# Patient Record
Sex: Male | Born: 1995 | Race: Black or African American | Hispanic: No | Marital: Single | Smoking: Never smoker
Health system: Southern US, Community
[De-identification: ages and names within clinical notes are randomized; demographics above are authoritative.]

## PROBLEM LIST (undated history)

## (undated) DIAGNOSIS — J45909 Unspecified asthma, uncomplicated: Secondary | ICD-10-CM

## (undated) DIAGNOSIS — T7840XA Allergy, unspecified, initial encounter: Secondary | ICD-10-CM

## (undated) DIAGNOSIS — M4846XA Fatigue fracture of vertebra, lumbar region, initial encounter for fracture: Secondary | ICD-10-CM

## (undated) HISTORY — DX: Fatigue fracture of vertebra, lumbar region, initial encounter for fracture: M48.46XA

## (undated) HISTORY — DX: Unspecified asthma, uncomplicated: J45.909

## (undated) HISTORY — PX: NO PAST SURGERIES: SHX2092

## (undated) HISTORY — DX: Allergy, unspecified, initial encounter: T78.40XA

## (undated) HISTORY — PX: UMBILICAL HERNIA REPAIR: SHX196

---
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
## 2016-05-21 ENCOUNTER — Emergency Department (INDEPENDENT_AMBULATORY_CARE_PROVIDER_SITE_OTHER): Payer: PRIVATE HEALTH INSURANCE

## 2016-05-21 ENCOUNTER — Encounter: Payer: Self-pay | Admitting: Emergency Medicine

## 2016-05-21 ENCOUNTER — Emergency Department (INDEPENDENT_AMBULATORY_CARE_PROVIDER_SITE_OTHER)
Admission: EM | Admit: 2016-05-21 | Discharge: 2016-05-21 | Disposition: A | Discharge status: Home / Self Care | Payer: PRIVATE HEALTH INSURANCE | Source: Home / Self Care | Attending: Family Medicine | Admitting: Family Medicine

## 2016-05-21 DIAGNOSIS — S6992XA Unspecified injury of left wrist, hand and finger(s), initial encounter: Secondary | ICD-10-CM | POA: Diagnosis not present

## 2016-05-21 DIAGNOSIS — M79642 Pain in left hand: Secondary | ICD-10-CM | POA: Diagnosis not present

## 2016-05-21 NOTE — ED Provider Notes (Signed)
CSN: [id number]     Arrival date & time 05/21/16  1003 History   First MD Initiated Contact with Patient 05/21/16 1019     Chief Complaint  Patient presents with  . Hand Injury    left   (Consider location/radiation/quality/duration/timing/severity/associated sxs/prior Treatment) HPI  Jon Salazar is a 21 y.o. male presenting to UC with c/o Left hand pain that started about 1 month ago while playing basketball.  Pain is still aching and sore. Minimal relief with OTC medications. He id ambidextrous.    Past Medical History:  Diagnosis Date  . Allergy   . Asthma    Past Surgical History:  Procedure Laterality Date  . UMBILICAL HERNIA REPAIR     Family History  Problem Relation Age of Onset  . Hypertension Mother    Social History  Substance Use Topics  . Smoking status: Never Smoker  . Smokeless tobacco: Never Used  . Alcohol use No    Review of Systems  Musculoskeletal: Positive for arthralgias and myalgias. Negative for joint swelling.  Skin: Negative for color change and wound.  Neurological: Negative for weakness and numbness.    Allergies  Peanut-containing drug products and Shellfish allergy  Home Medications   Prior to Admission medications   Medication Sig Start Date End Date Taking? Authorizing Provider  albuterol (PROVENTIL HFA;VENTOLIN HFA) 108 (90 BASE) MCG/ACT inhaler Inhale 2 puffs into the lungs every 6 (six) hours as needed.    Historical Provider, MD  AMBULATORY NON FORMULARY MEDICATION TLSO brace to be worn at all times. 08/04/12   Monica Bectonhomas J Thekkekandam, MD  beclomethasone (QVAR) 80 MCG/ACT inhaler Inhale 1 puff into the lungs as needed.    Historical Provider, MD  cetirizine (ZYRTEC) 10 MG tablet Take 10 mg by mouth daily.    Historical Provider, MD  montelukast (SINGULAIR) 10 MG tablet Take 10 mg by mouth at bedtime.    Historical Provider, MD   Meds Ordered and Administered this Visit  Medications - No data to display  BP 132/80 (BP  Location: Right Arm)   Pulse (!) 51   Temp 98 F (36.7 C) (Oral)   Resp 16   Ht 6\' 3"  (1.905 m)   Wt 150 lb (68 kg)   SpO2 100%   BMI 18.75 kg/m  No data found.   Physical Exam  Constitutional: He is oriented to person, place, and time. He appears well-developed and well-nourished.  HENT:  Head: Normocephalic and atraumatic.  Eyes: EOM are normal.  Neck: Normal range of motion.  Cardiovascular: Normal rate.   Pulmonary/Chest: Effort normal.  Musculoskeletal: Normal range of motion. He exhibits tenderness. He exhibits no edema or deformity.  Left hand: no obvious edema or deformity. Full ROM. Tenderness over 5th MCP joint. No crepitus.  Full ROM wrist, non-tender.  Neurological: He is alert and oriented to person, place, and time.  Skin: Skin is warm and dry. Capillary refill takes less than 2 seconds.  Left hand: skin in tact. No ecchymosis or erythema.  Psychiatric: He has a normal mood and affect. His behavior is normal.  Nursing note and vitals reviewed.   Urgent Care Course     Procedures (including critical care time)  Labs Review Labs Reviewed - No data to display  Imaging Review Dg Hand Complete Left  Result Date: 05/21/2016 CLINICAL DATA:  Left hand pain, injury 1 month ago, fifth metacarpal pain EXAM: LEFT HAND - COMPLETE 3+ VIEW COMPARISON:  None. FINDINGS: Three views of the left  hand submitted. No acute fracture or subluxation. No radiopaque foreign body. Joint spaces are preserved. IMPRESSION: Negative. Electronically Signed   By: Natasha Mead M.D.   On: 05/21/2016 10:53    MDM   1. Injury of left hand, initial encounter    Hx and exam c/w Left hand sprain Offered hand/wrist splint. Pt declined Encouraged ice, rest, acetaminophen and ibuprofen f/u with Sports Medicine in 1-2 weeks if not improving.     Junius Finner, PA-C 05/21/16 1111

## 2016-05-21 NOTE — ED Triage Notes (Signed)
Patient reports injuring left hand while playing basketball about a month ago; still sore and OTCs don't relieve discomfort.

## 2017-07-11 ENCOUNTER — Encounter: Payer: Self-pay | Admitting: Physician Assistant

## 2017-07-11 ENCOUNTER — Ambulatory Visit (INDEPENDENT_AMBULATORY_CARE_PROVIDER_SITE_OTHER): Payer: PRIVATE HEALTH INSURANCE | Admitting: Physician Assistant

## 2017-07-11 VITALS — BP 124/84 | HR 59 | Ht 74.5 in | Wt 154.0 lb

## 2017-07-11 DIAGNOSIS — Z91018 Allergy to other foods: Secondary | ICD-10-CM | POA: Diagnosis not present

## 2017-07-11 DIAGNOSIS — M25561 Pain in right knee: Secondary | ICD-10-CM | POA: Diagnosis not present

## 2017-07-11 DIAGNOSIS — Z130 Encounter for screening for diseases of the blood and blood-forming organs and certain disorders involving the immune mechanism: Secondary | ICD-10-CM

## 2017-07-11 DIAGNOSIS — Z0000 Encounter for general adult medical examination without abnormal findings: Secondary | ICD-10-CM

## 2017-07-11 DIAGNOSIS — Z91013 Allergy to seafood: Secondary | ICD-10-CM

## 2017-07-11 DIAGNOSIS — J4530 Mild persistent asthma, uncomplicated: Secondary | ICD-10-CM | POA: Diagnosis not present

## 2017-07-11 DIAGNOSIS — R030 Elevated blood-pressure reading, without diagnosis of hypertension: Secondary | ICD-10-CM | POA: Insufficient documentation

## 2017-07-11 DIAGNOSIS — Z973 Presence of spectacles and contact lenses: Secondary | ICD-10-CM

## 2017-07-11 DIAGNOSIS — Z13220 Encounter for screening for lipoid disorders: Secondary | ICD-10-CM

## 2017-07-11 DIAGNOSIS — G8929 Other chronic pain: Secondary | ICD-10-CM | POA: Diagnosis not present

## 2017-07-11 DIAGNOSIS — M2241 Chondromalacia patellae, right knee: Secondary | ICD-10-CM | POA: Insufficient documentation

## 2017-07-11 DIAGNOSIS — Z131 Encounter for screening for diabetes mellitus: Secondary | ICD-10-CM | POA: Diagnosis not present

## 2017-07-11 MED ORDER — ALBUTEROL SULFATE HFA 108 (90 BASE) MCG/ACT IN AERS: INHALATION_SPRAY | RESPIRATORY_TRACT | 3 refills | Status: DC

## 2017-07-11 MED ORDER — EPINEPHRINE 0.3 MG/0.3ML IJ SOAJ: 0.3000 mg | Freq: Once | INTRAMUSCULAR | 2 refills | Status: DC | PRN

## 2017-07-11 NOTE — Progress Notes (Signed)
HPI:                                                                Jon Salazar is a 22 y.o. male who presents to [facility]: Primary Care Sports Medicine today to establish care  Current concerns: annual physical exam   Depression screen [facility] 2/9 07/11/2017  Decreased Interest 0  Down, Depressed, Hopeless 0  PHQ - 2 Score 0    No flowsheet data found.    Past Medical History:  Diagnosis Date  . Allergy   . Asthma   . Lumbar stress fracture    Past Surgical History:  Procedure Laterality Date  . NO PAST SURGERIES    . UMBILICAL HERNIA REPAIR     Social History   Tobacco Use  . Smoking status: Never Smoker  . Smokeless tobacco: Never Used  Substance Use Topics  . Alcohol use: No   family history includes Hypertension in his mother.    ROS: Review of Systems  Musculoskeletal: Positive for joint pain (R knee).  All other systems reviewed and are negative.    Medications: Current Outpatient Medications  Medication Sig Dispense Refill  . albuterol (PROVENTIL HFA;VENTOLIN HFA) 108 (90 Base) MCG/ACT inhaler Inhale 2 puffs, 15 minutes prior to exertion/exercise. Inhale 1-2 puffs Q4H prn for wheezing/SOB 1 Inhaler 3  . EPINEPHrine 0.3 mg/0.3 mL IJ SOAJ injection Inject 0.3 mLs (0.3 mg total) into the muscle once as needed for up to 1 dose (anaphylactic reaction). 2 Device 2   No current facility-administered medications for this visit.    Allergies  Allergen Reactions  . Peanut-Containing Drug Products   . Shellfish Allergy        Objective:  BP 124/84   Pulse (!) 59   Ht 6' 2.5" (1.892 m)   Wt 154 lb (69.9 kg)   SpO2 100%   BMI 19.51 kg/m  General Appearance:  Alert, cooperative, no distress, appropriate for age                            Head:  Normocephalic, without obvious abnormality                             Eyes:  PERRL, EOM's intact, conjunctiva and cornea clear, wearing contact lenses  Ears:  TM pearly gray color and semitransparent, external ear canals normal, both ears                            Nose:  Nares symmetrical                          Throat:  Lips, tongue, and mucosa are moist, pink, and intact; good dentition                             Neck:  Supple; symmetrical, trachea midline, no adenopathy; thyroid: no enlargement, symmetric, no tenderness/mass/nodules  Back:  Symmetrical, no curvature, ROM normal                                         Lungs: normal effort, normal phonation, End expiratory wheezes in the left lower lung fields                            Heart:  regular rate & normal rhythm, S1 and S2 normal, no murmurs, rubs, or gallops                     Abdomen:  Soft, non-tender, no mass or organomegaly              Genitourinary:  deferred         Musculoskeletal:  Tone and strength strong and symmetrical, all extremities;  Right knee: atraumatic, no joint line tenderness of tenderness over the patella, no pain with varus or valgus stress, ROM intact                                       Lymphatic:  No adenopathy             Skin/Hair/Nails:  Skin warm, dry and intact, no rashes or abnormal dyspigmentation on limited exam                   Neurologic:  Alert and oriented x3, no cranial nerve deficits, DTR's intact, sensation grossly intact, normal gait and station, no tremor Psych: well-groomed, cooperative, good eye contact, euthymic mood, affect mood-congruent, speech is articulate, and thought processes clear and goal-directed     No results found for this or any previous visit (from the past 72 hour(s)). No results found.    Assessment and Plan: 22 y.o. male with   Encounter for annual physical exam - Plan: CBC, Comprehensive metabolic panel, Lipid Panel w/reflex Direct LDL  Mild persistent asthma without complication - Plan: albuterol (PROVENTIL HFA;VENTOLIN HFA) 108 (90 Base) MCG/ACT inhaler  Shellfish allergy  - Plan: EPINEPHrine 0.3 mg/0.3 mL IJ SOAJ injection  Tree nut allergy - Plan: EPINEPHrine 0.3 mg/0.3 mL IJ SOAJ injection  Chronic pain of right knee  Screening for blood disease - Plan: CBC, Comprehensive metabolic panel  Screening for lipid disorders - Plan: Lipid Panel w/reflex Direct LDL  Screening for diabetes mellitus - Plan: Comprehensive metabolic panel  Wears contact lenses  Elevated blood pressure reading  - Personally reviewed PMH, PSH, PFH, medications, allergies, HM - Age-appropriate cancer screening: n/a - Immunizaitons reviewed and UTD - Declines HPV vaccine - Declines STI screening - PHQ2 negative  Asthma Mild end-expiratory wheezes on the left. SpO2 100% on RA at rest. Patient is asymptomatic. Has not used Albuterol in >5 years. No recent exacerbations or hospitalizations. Encouraged to use bronchodilator 2 puffs prior to exercise/exertion Epi-pen refilled for anaphylactic food allergies  Elevated blood pressure reading - improved on recheck - active surveillance  Patient education and anticipatory guidance given Patient agrees with treatment plan Follow-up in 6 months for asthma or sooner as needed if symptoms worsen or fail to improve  Levonne Hubert PA-C

## 2017-07-11 NOTE — Patient Instructions (Addendum)
Schedule an appointment with Sports Medicine as needed for knee pain Take Aleve 1-2 tabs every 12 hours as needed for pain Ice x 20 minutes every 3-4 hours as needed for pain/swelling

## 2017-07-12 LAB — COMPREHENSIVE METABOLIC PANEL
AG Ratio: 1.9 (calc) (ref 1.0–2.5)
ALT: 18 U/L (ref 9–46)
AST: 20 U/L (ref 10–40)
Albumin: 4.7 g/dL (ref 3.6–5.1)
Alkaline phosphatase (APISO): 88 U/L (ref 40–115)
BILIRUBIN TOTAL: 0.6 mg/dL (ref 0.2–1.2)
BUN: 7 mg/dL (ref 7–25)
CALCIUM: 9.8 mg/dL (ref 8.6–10.3)
CHLORIDE: 104 mmol/L (ref 98–110)
CO2: 29 mmol/L (ref 20–32)
Creat: 1.06 mg/dL (ref 0.60–1.35)
Globulin: 2.5 g/dL (calc) (ref 1.9–3.7)
Glucose, Bld: 84 mg/dL (ref 65–99)
Potassium: 4.2 mmol/L (ref 3.5–5.3)
Sodium: 139 mmol/L (ref 135–146)
Total Protein: 7.2 g/dL (ref 6.1–8.1)

## 2017-07-12 LAB — LIPID PANEL W/REFLEX DIRECT LDL
CHOLESTEROL: 152 mg/dL (ref <200)
HDL: 65 mg/dL (ref >40)
LDL Cholesterol (Calc): 73 mg/dL (calc)
Non-HDL Cholesterol (Calc): 87 mg/dL (calc) (ref <130)
Total CHOL/HDL Ratio: 2.3 (calc) (ref <5.0)
Triglycerides: 48 mg/dL (ref <150)

## 2017-07-12 LAB — CBC
HCT: 44.8 % (ref 38.5–50.0)
Hemoglobin: 14.6 g/dL (ref 13.2–17.1)
MCH: 28.1 pg (ref 27.0–33.0)
MCHC: 32.6 g/dL (ref 32.0–36.0)
MCV: 86.3 fL (ref 80.0–100.0)
MPV: 10.4 fL (ref 7.5–12.5)
Platelets: 256 10*3/uL (ref 140–400)
RBC: 5.19 10*6/uL (ref 4.20–5.80)
RDW: 11.5 % (ref 11.0–15.0)
WBC: 4.6 10*3/uL (ref 3.8–10.8)

## 2017-07-13 NOTE — Progress Notes (Signed)
Your labs look great! - normal kidney function - cholesterol in a healthy range - normal blood counts - no evidence of diabetes

## 2018-01-14 ENCOUNTER — Ambulatory Visit (INDEPENDENT_AMBULATORY_CARE_PROVIDER_SITE_OTHER): Payer: PRIVATE HEALTH INSURANCE | Admitting: Physician Assistant

## 2018-01-14 ENCOUNTER — Encounter: Payer: Self-pay | Admitting: Physician Assistant

## 2018-01-14 VITALS — BP 130/85 | HR 62 | Wt 156.0 lb

## 2018-01-14 DIAGNOSIS — Z23 Encounter for immunization: Secondary | ICD-10-CM

## 2018-01-14 DIAGNOSIS — J4520 Mild intermittent asthma, uncomplicated: Secondary | ICD-10-CM | POA: Diagnosis not present

## 2018-01-14 DIAGNOSIS — R030 Elevated blood-pressure reading, without diagnosis of hypertension: Secondary | ICD-10-CM

## 2018-01-14 NOTE — Progress Notes (Signed)
HPI:                                                                Danton SewerRobert L Treadway II is a 22 y.o. male who [facility] Sports Medicine today for asthma follow-up  Asthma - denies coughing, wheezing, shortness of breath, chest tightness. Has not used rescue inhaler. Reports going to the gym and playing basketball regularly without issues   [facility] 2[facility] 01/14/2018 07/11/2017  Decreased Interest 0 0  Down, Depressed, Hopeless 0 0  PHQ - 2 Score 0 0    No flowsheet data found.    Past Medical History:  Diagnosis Date  . Allergy   . Asthma   . Lumbar stress fracture    Past Surgical History:  Procedure Laterality Date  . NO PAST SURGERIES    . UMBILICAL HERNIA REPAIR     Social History   Tobacco Use  . Smoking status: Never Smoker  . Smokeless tobacco: Never Used  Substance Use Topics  . Alcohol use: No   family history includes Hypertension in his mother.    ROS: negative except as noted in the HPI  Medications: Current Outpatient Medications  Medication Sig Dispense Refill  . albuterol (PROVENTIL HFA;VENTOLIN HFA) 108 (90 Base) MCG/ACT inhaler Inhale 2 puffs, 15 minutes prior to exertion/exercise. Inhale 1-2 puffs Q4H prn for wheezing/SOB 1 Inhaler 3  . EPINEPHrine 0.3 mg/0.3 mL IJ SOAJ injection Inject 0.3 mLs (0.3 mg total) into the muscle once as needed for up to 1 dose (anaphylactic reaction). 2 Device 2   No current facility-administered medications for this visit.    Allergies  Allergen Reactions  . Peanut-Containing Drug Products   . Shellfish Allergy        Objective:  BP 130/85   Pulse 62   Wt 156 lb (70.8 kg)   SpO2 98%   BMI 19.76 kg/m  Gen:  alert, not ill-appearing, no distress, appropriate for age HEENT: head normocephalic without obvious abnormality, conjunctiva and cornea clear, trachea midline Pulm: Normal work of breathing, normal phonation, clear to auscultation  bilaterally, no wheezes, rales or rhonchi CV: Normal rate, regular rhythm, s1 and s2 distinct, no murmurs, clicks or rubs  Neuro: alert and oriented x 3, no tremor MSK: extremities atraumatic, normal gait and station Skin: intact, no rashes on exposed skin, no jaundice, no cyanosis   No results found for this or any previous visit (from the past 72 hour(s)). No results found.    Assessment and Plan: 22 y.o. male with   .Molly MaduroRobert was seen today for asthma.  Diagnoses and all orders for this visit:  Mild intermittent asthma without complication  Elevated blood pressure reading  Need for immunization against influenza -     Flu Vaccine QUAD 36+ mos IM  Need for 23-polyvalent pneumococcal polysaccharide vaccine -     Pneumococcal polysaccharide vaccine 23-valent greater than or equal to 2yo subcutaneous/IM   Asthma well controlled Pneumovax and influenza vaccines given in office today Cont Albuterol prn  BP out of range in office today. Last 3 office readings in stage 1 hypertensive range. Family hx of HTN. Otherwise no CVD risk factors Patient to monitor and log BP's at home. F/u in office  if BP is >140/90. Otherwise will continue to manage with lifestyle measures  Patient education and anticipatory guidance given Patient agrees with treatment plan Follow-up in 6 months for annual CPE/BP or sooner as needed if symptoms worsen or fail to improve  Levonne Hubert PA-C

## 2018-01-14 NOTE — Patient Instructions (Signed)
Asthma Attack Prevention, Adult Although you may not be able to control the fact that you have asthma, you can take actions to prevent episodes of asthma (asthma attacks). These actions include:  Creating a written plan for managing and treating your asthma attacks (asthma action plan).  Monitoring your asthma.  Avoiding things that can irritate your airways or make your asthma symptoms worse (asthma triggers).  Taking your medicines as directed.  Acting quickly if you have signs or symptoms of an asthma attack.  What are some ways to prevent an asthma attack? Create a plan Work with your health care provider to create an asthma action plan. This plan should include:  A list of your asthma triggers and how to avoid them.  A list of symptoms that you experience during an asthma attack.  Information about when to take medicine and how much medicine to take.  Information to help you understand your peak flow measurements.  Contact information for your health care providers.  Daily actions that you can take to control asthma.  Monitor your asthma  To monitor your asthma:  Use your peak flow meter every morning and every evening for 2-3 weeks. Record the results in a journal. A drop in your peak flow numbers on one or more days may mean that you are starting to have an asthma attack, even if you are not having symptoms.  When you have asthma symptoms, write them down in a journal.  Avoid asthma triggers  Work with your health care provider to find out what your asthma triggers are. This can be done by:  Being tested for allergies.  Keeping a journal that notes when asthma attacks occur and what may have contributed to them.  Asking your health care provider whether other medical conditions make your asthma worse.  Common asthma triggers include:  Dust.  Smoke. This includes campfire smoke and secondhand smoke from tobacco products.  Pet dander.  Trees, grasses or  pollens.  Very cold, dry, or humid air.  Mold.  Foods that contain high amounts of sulfites.  Strong smells.  Engine exhaust and air pollution.  Aerosol sprays and fumes from household cleaners.  Household pests and their droppings, including dust mites and cockroaches.  Certain medicines, including NSAIDs.  Once you have determined your asthma triggers, take steps to avoid them. Depending on your triggers, you may be able to reduce the chance of an asthma attack by:  Keeping your home clean. Have someone dust and vacuum your home for you 1 or 2 times a week. If possible, have them use a high-efficiency particulate arrestance (HEPA) vacuum.  Washing your sheets weekly in hot water.  Using allergy-proof mattress covers and casings on your bed.  Keeping pets out of your home.  Taking care of mold and water problems in your home.  Avoiding areas where people smoke.  Avoiding using strong perfumes or odor sprays.  Avoid spending a lot of time outdoors when pollen counts are high and on very windy days.  Talking with your health care provider before stopping or starting any new medicines.  Medicines Take over-the-counter and prescription medicines only as told by your health care provider. Many asthma attacks can be prevented by carefully following your medicine schedule. Taking your medicines correctly is especially important when you cannot avoid certain asthma triggers. Even if you are doing well, do not stop taking your medicine and do not take less medicine. Act quickly If an asthma attack happens, acting quickly   can decrease how severe it is and how long it lasts. Take these actions:  Pay attention to your symptoms. If you are coughing, wheezing, or having difficulty breathing, do not wait to see if your symptoms go away on their own. Follow your asthma action plan.  If you have followed your asthma action plan and your symptoms are not improving, call your health care  provider or seek immediate medical care at the nearest hospital.  It is important to write down how often you need to use your fast-acting rescue inhaler. You can track how often you use an inhaler in your journal. If you are using your rescue inhaler more often, it may mean that your asthma is not under control. Adjusting your asthma treatment plan may help you to prevent future asthma attacks and help you to gain better control of your condition. How can I prevent an asthma attack when I exercise?  Exercise is a common asthma trigger. To prevent asthma attacks during exercise:  Follow advice from your health care provider about whether you should use your fast-acting inhaler before exercising. Many people with asthma experience exercise-induced bronchoconstriction (EIB). This condition often worsens during vigorous exercise in cold, humid, or dry environments. Usually, people with EIB can stay very active by using a fast-acting inhaler before exercising.  Avoid exercising outdoors in very cold or humid weather.  Avoid exercising outdoors when pollen counts are high.  Warm up and cool down when exercising.  Stop exercising right away if asthma symptoms start.  Consider taking part in exercises that are less likely to cause asthma symptoms such as:  Indoor swimming.  Biking.  Walking.  Hiking.  Playing football.  This information is not intended to replace advice given to you by your health care provider. Make sure you discuss any questions you have with your health care provider. Document Released: 02/06/2009 Document Revised: 10/20/2015 Document Reviewed: 08/05/2015 Elsevier Interactive Patient Education  2018 [employer]  

## 2018-02-25 IMAGING — DX DG HAND COMPLETE 3+V*L*
3 series · 3 of 3 positions shown · non-contrast
Comparison: None.

CLINICAL DATA: Left hand pain, injury 1 month ago, fifth metacarpal
pain

EXAM:
LEFT HAND - COMPLETE 3+ VIEW

[hand pa]
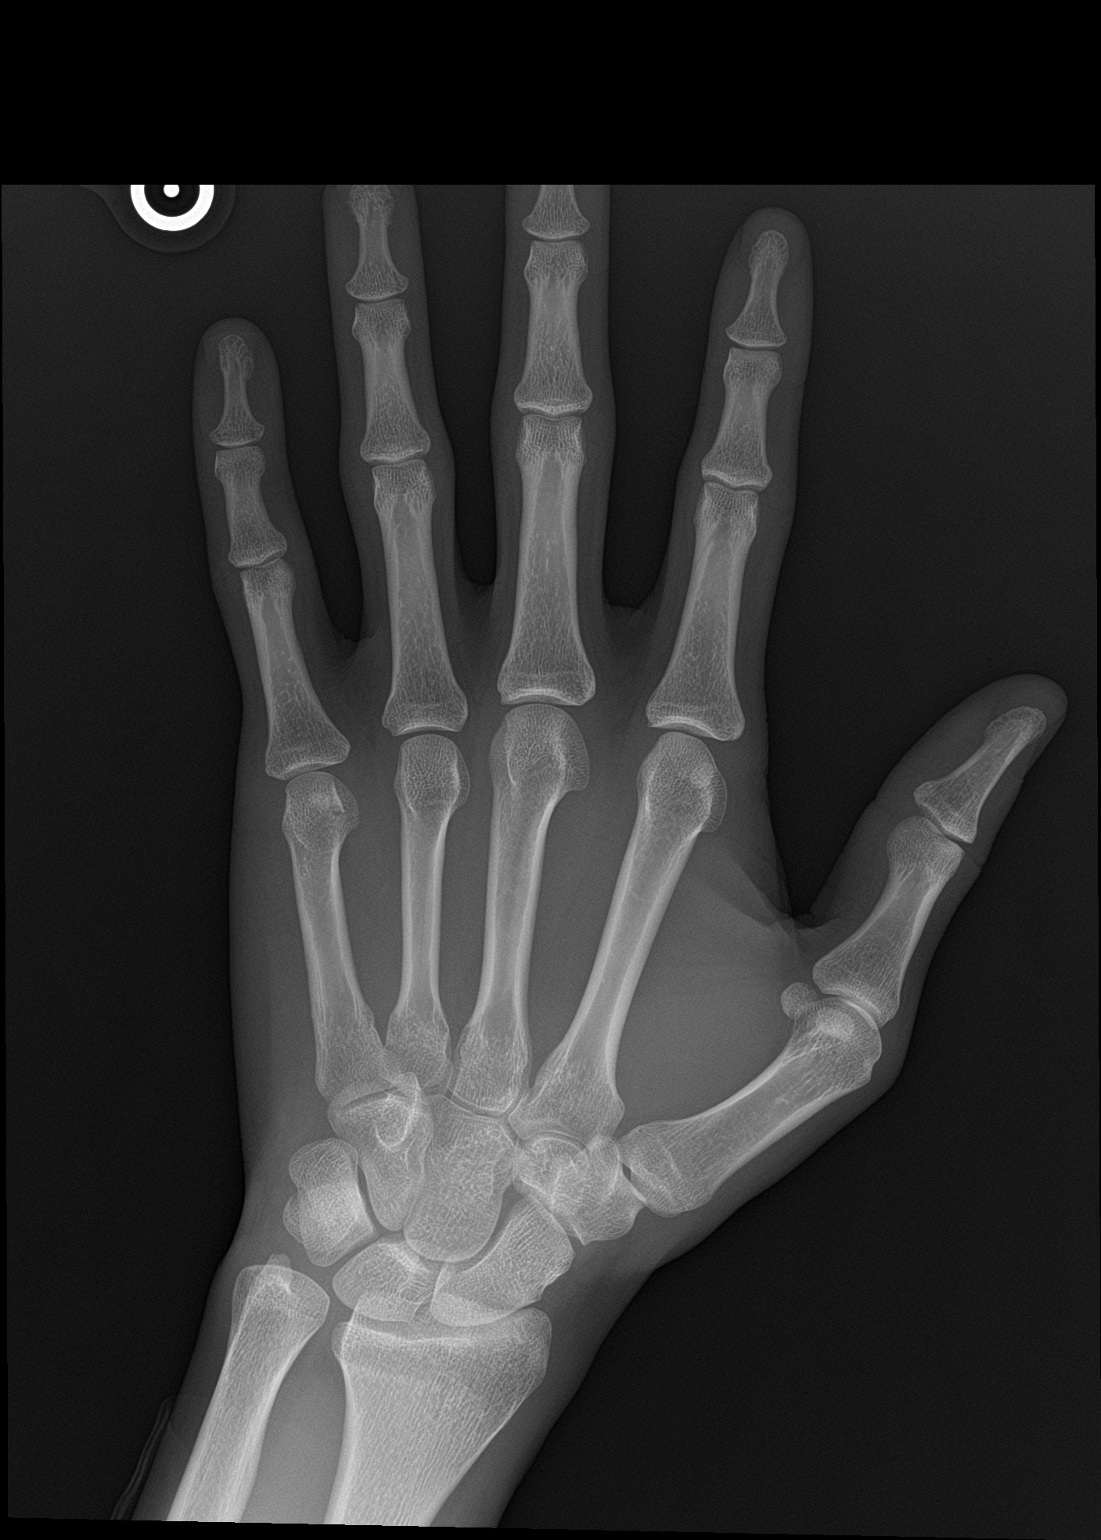

[hand obl]
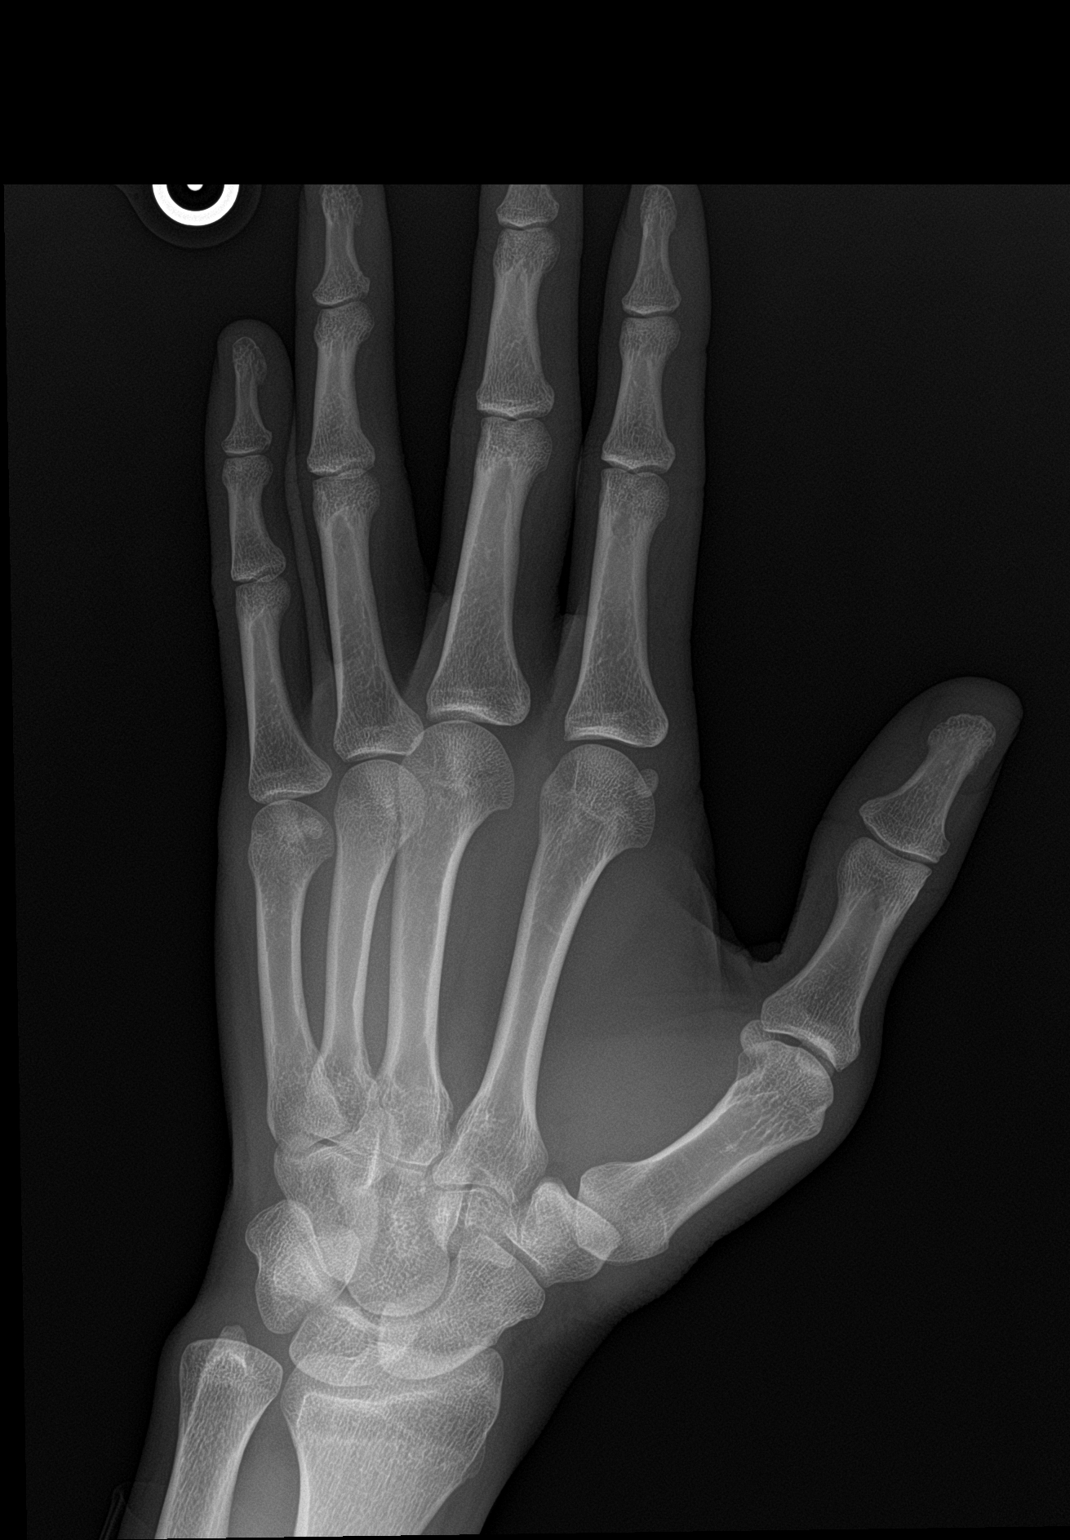

[hand lat]
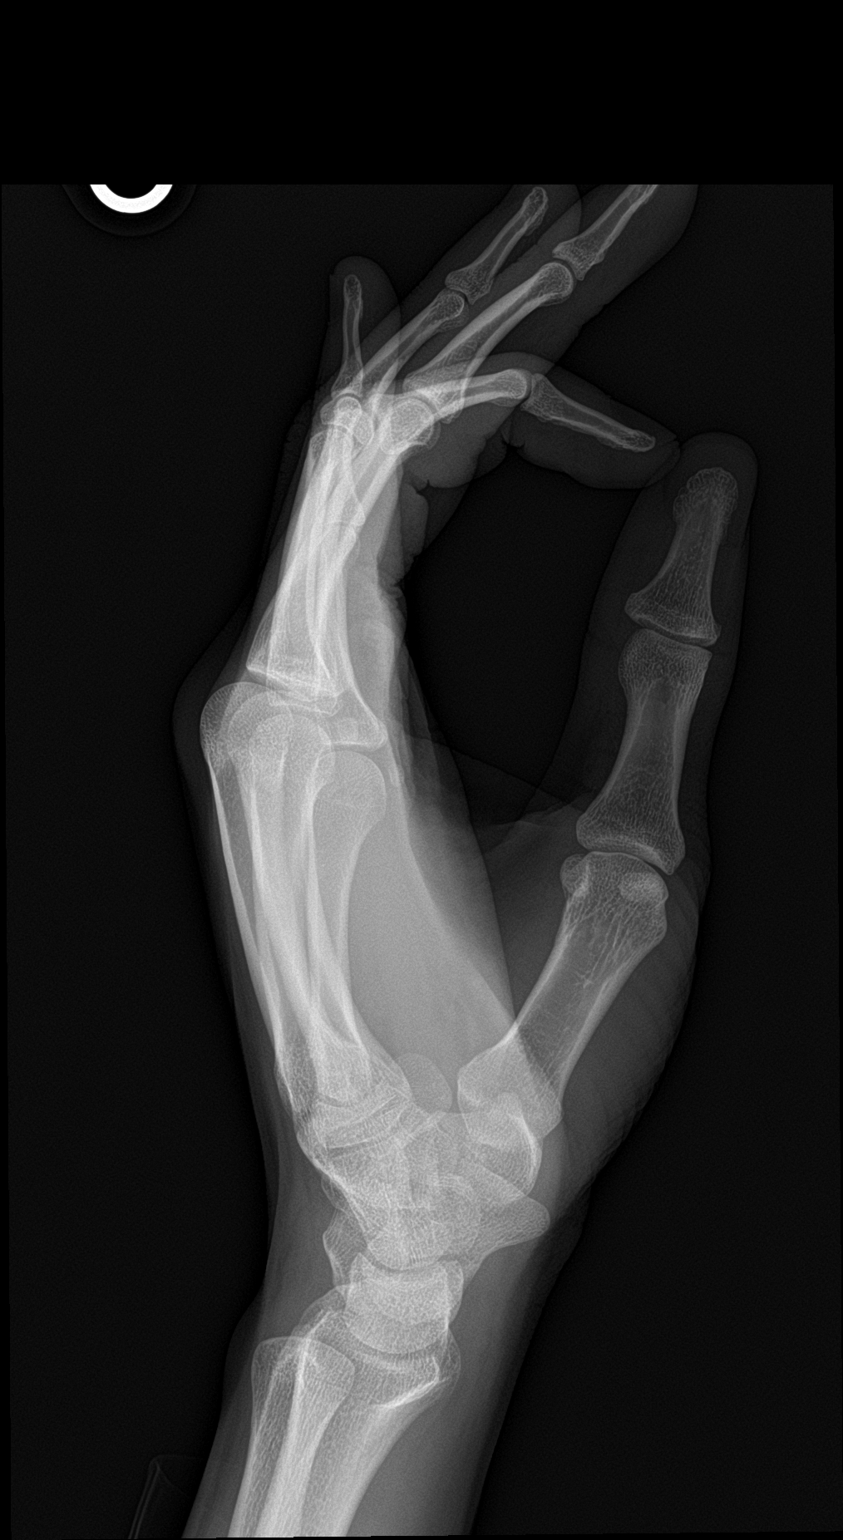

[3 of 3 positions shown; findings below may reference images not displayed]

FINDINGS: Three views of the left hand submitted. No acute fracture or
subluxation. No radiopaque foreign body. Joint spaces are preserved.
IMPRESSION: Negative.

## 2018-03-20 ENCOUNTER — Encounter: Payer: Self-pay | Admitting: Physician Assistant

## 2018-03-20 ENCOUNTER — Ambulatory Visit (INDEPENDENT_AMBULATORY_CARE_PROVIDER_SITE_OTHER): Payer: PRIVATE HEALTH INSURANCE

## 2018-03-20 ENCOUNTER — Ambulatory Visit (INDEPENDENT_AMBULATORY_CARE_PROVIDER_SITE_OTHER): Payer: PRIVATE HEALTH INSURANCE | Admitting: Physician Assistant

## 2018-03-20 VITALS — BP 130/84 | HR 69 | Wt 155.0 lb

## 2018-03-20 DIAGNOSIS — S86002A Unspecified injury of left Achilles tendon, initial encounter: Secondary | ICD-10-CM

## 2018-03-20 NOTE — Progress Notes (Signed)
HPI:                                                                Jon Salazar is a 23 y.o. 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 Worse with climbing stairs. Pain is waxing and waning. Denies known injury or trauma. He attempted to play basketball and had to stop do to pain. He has been wearing a compression sock, which is helpful. He initially treated with Ibuprofen, which was also helpful.   Past Medical History:  Diagnosis Date  . Allergy   . Asthma   . Lumbar stress fracture    Past Surgical History:  Procedure Laterality Date  . NO PAST SURGERIES    . UMBILICAL HERNIA REPAIR     Social History   Tobacco Use  . Smoking status: Never Smoker  . Smokeless tobacco: Never Used  Substance Use Topics  . Alcohol use: No   family history includes Hypertension in his mother.    ROS: negative except as noted in the HPI  Medications: Current Outpatient Medications  Medication Sig Dispense Refill  . albuterol (PROVENTIL HFA;VENTOLIN HFA) 108 (90 Base) MCG/ACT inhaler Inhale 2 puffs, 15 minutes prior to exertion/exercise. Inhale 1-2 puffs Q4H prn for wheezing/SOB 1 Inhaler 3  . EPINEPHrine 0.3 mg/0.3 mL IJ SOAJ injection Inject 0.3 mLs (0.3 mg total) into the muscle once as needed for up to 1 dose (anaphylactic reaction). 2 Device 2   No current facility-administered medications for this visit.    Allergies  Allergen Reactions  . Peanut-Containing Drug Products   . Shellfish Allergy        Objective:  BP 130/84   Pulse 69   Wt 155 lb (70.3 kg)   BMI 19.63 kg/m  Gen:  alert, not ill-appearing, no distress, appropriate for age HEENT: head normocephalic without obvious abnormality, conjunctiva and cornea clear, trachea midline Pulm: Normal work  of breathing, normal phonation Neuro: alert and oriented x 3, no tremor MSK: extremities atraumatic, normal gait and station, distal pulses intact Left ankle: atraumatic, negative anterior drawer, strength intact, pain reproduced in the upper achilles with dorsiflexion, achilles tendon intact  No results found for this or any previous visit (from the past 72 hour(s)). No results found.    Assessment and Plan: 23 y.o. male with   .Vard was seen today for foot pain.  Diagnoses and all orders for this visit:  Achilles tendon injury, left, initial encounter -     Ambulatory referral to Physical Therapy -     DG Ankle Complete Left   Continue to avoid sports and limit activity Ice and NSAID prn for pain Referral placed for formal PT   Patient education and anticipatory guidance given Patient agrees with treatment plan Follow-up with sports medicine in 2 weeks or sooner as needed if symptoms worsen or fail to improve  Levonne Hubert PA-C

## 2018-03-22 ENCOUNTER — Encounter: Payer: Self-pay | Admitting: Physician Assistant

## 2018-03-25 ENCOUNTER — Other Ambulatory Visit: Payer: Self-pay

## 2018-03-25 ENCOUNTER — Encounter: Payer: Self-pay | Admitting: Rehabilitative and Restorative Service Providers"

## 2018-03-25 ENCOUNTER — Ambulatory Visit (INDEPENDENT_AMBULATORY_CARE_PROVIDER_SITE_OTHER): Payer: PRIVATE HEALTH INSURANCE | Admitting: Rehabilitative and Restorative Service Providers"

## 2018-03-25 DIAGNOSIS — M79662 Pain in left lower leg: Secondary | ICD-10-CM | POA: Diagnosis not present

## 2018-03-25 DIAGNOSIS — R29898 Other symptoms and signs involving the musculoskeletal system: Secondary | ICD-10-CM

## 2018-03-25 NOTE — Patient Instructions (Signed)
Access Code: [email or web address]  URL: [email or web address]  Date: 03/25/2018  Prepared by: Corlis Leak   Exercises  Supine Hamstring Stretch with Strap - 10 reps - 1 sets - 30 seconds hold - 2x daily - 7x weekly  Supine ITB Stretch with Strap - 3 reps - 1 sets - 30 seconds hold - 2x daily - 7x weekly  Supine Piriformis Stretch with Leg Straight - 3 reps - 1 sets - 30 seconds hold - 2x daily - 7x weekly  Prone Quadriceps Stretch with Strap - 3 reps - 1 sets - 30 seconds hold - 2x daily - 7x weekly  Gastroc Stretch on Wall - 3 reps - 1 sets - 30 sec hold - 2x daily - 7x weekly  Standing Soleus Stretch - 3 reps - 1 sets - 30 sec hold - 2x daily - 7x weekly  Seated Anterior Tibialis Stretch - 3 reps - 1 sets - 30 sec hold - 2x daily - 7x weekly

## 2018-03-25 NOTE — Therapy (Signed)
[facility] [place] [address] [place], [place], [address] Phone: [phone]   Fax:  [phone]  Physical Therapy Evaluation  Patient Details  Name: Jon Salazar MRN: [id number] Date of Birth: [birth year] Referring Provider (PT): Gena Fray PA-C    Encounter Date: 03/25/2018  PT End of Session - 03/25/18 1251    Visit Number  1    Number of Visits  12    Date for PT Re-Evaluation  05/06/18    PT Start Time  0930    PT Stop Time  1026    PT Time Calculation (min)  56 min    Activity Tolerance  Patient tolerated treatment well       Past Medical History:  Diagnosis Date  . Allergy   . Asthma   . Lumbar stress fracture     Past Surgical History:  Procedure Laterality Date  . NO PAST SURGERIES    . UMBILICAL HERNIA REPAIR      There were no vitals filed for this visit.   Subjective Assessment - 03/25/18 0932    Subjective  Patient reports that he noticed the back of his Achille's tendon hurt the end of November 2019. He has rested the leg and stopped playing basketball for several weeks at a time. He has recurrent symptoms when he tries to return to ball.     Pertinent History  several Lt ankle sprains - last time in June 2019 - has sprained lt ankle 3-4 times in the past 5 yrs     Patient Stated Goals  return to basketball without Lt leg pain     Currently in Pain?  No/denies    Pain Score  0-No pain    Pain Location  Heel    Pain Orientation  Left;Posterior    Pain Descriptors / Indicators  Sharp    Pain Type  Acute pain    Pain Radiating Towards  lateral anterior ankle    Pain Onset  More than a month ago    Pain Frequency  Intermittent    Aggravating Factors   basketball; going up stairs; standing up fast - putting too much weight on the foot/ankle     Pain Relieving Factors  compression sleeve when playing basketball          [facility] PT Assessment - 03/25/18 0001      Assessment   Medical  Diagnosis  Lt Achille's Tendonitis     Referring Provider (PT)  Gena Fray PA-C     Onset Date/Surgical Date  01/02/18    Hand Dominance  Right;Left    Next MD Visit  04/03/2018    Prior Therapy  none       Precautions   Precautions  None      Balance Screen   Has the patient fallen in the past 6 months  No    Has the patient had a decrease in activity level because of a fear of falling?   No    Is the patient reluctant to leave their home because of a fear of falling?   No      Prior Function   Level of Independence  Independent    Vocation  Student    Vocation Requirements  desk computer    Leisure  basketball 3-4 times a week for ~ 2-3 hours each time      Observation/Other Assessments   Focus on Therapeutic Outcomes (FOTO)   52% limitation  Sensation   Additional Comments  WFL's per pt report       Posture/Postural Control   Posture Comments  sits with rounded posture; stands head forward; Lt LE in ER       AROM   Overall AROM Comments  tight throughout bilat hips Lt > Rt     Right/Left Ankle  --   tight Lt ankle DF compared to Rt      Strength   Overall Strength Comments  5/5 bilat LE's some discomfort with heel raise Lt x 10       Flexibility   Hamstrings  Rt 60 deg; Lt 50 deg     Quadriceps  tight Lt > Rt    ITB  tight Lt > Rt     Piriformis  tight Lt > Rt       Palpation   Palpation comment  muscular tightness and banding Lt gastroc and soleus into Achille's tendon       Ambulation/Gait   Gait Comments  normal gait pattern with no evidence of limp/decreased wt bearing Lt LE                 Objective measurements completed on examination: See above findings.      [facility] Adult PT Treatment/Exercise - 03/25/18 0001      Knee/Hip Exercises: Stretches   Passive Hamstring Stretch  Left;2 reps;30 seconds   supine with strap    Quad Stretch  Left;2 reps;30 seconds   prone with strap    ITB Stretch  Left;2 reps;30 seconds   supine w/  strap    Piriformis Stretch  Left;2 reps;30 seconds   supine travell    Gastroc Stretch  Left;2 reps;30 seconds    Soleus Stretch  Left;2 reps;30 seconds    Other Knee/Hip Stretches  ant tib stretch sitting 20 sec x 2 Lt       Moist Heat Therapy   Number Minutes Moist Heat  12 Minutes    Moist Heat Location  Ankle      Iontophoresis   Type of Iontophoresis  Dexamethasone    Location  anterior ankledorsal to medial malleolus     Dose  120 mAmp    Time  12 hours              PT Education - 03/25/18 1251    Education Details  HEP POC Ionto     Person(s) Educated  Patient    Methods  Explanation;Demonstration;Tactile cues;Verbal cues;Handout    Comprehension  Verbalized understanding;Returned demonstration;Verbal cues required;Tactile cues required          PT Long Term Goals - 03/25/18 1244      PT LONG TERM GOAL #1   Title  Decrease pain Lt Achille's and foot/ankle by 75-100% alowing patient to return to all normal functional activities 05/06/2018    Time  6    Period  Weeks    Status  New      PT LONG TERM GOAL #2   Title  Increased tissue extensibility and ROM Lt LE with patient to demonstrate mobility =/> than the Rt LE 05/06/2018    Time  6    Period  Weeks    Status  New      PT LONG TERM GOAL #3   Title  Patient reports return to basketball for 1 to 2 hours 2-3 times/wk 05/06/2018    Time  6    Period  Weeks    Status  New      PT LONG TERM GOAL #4   Title  Independent in HEP 05/06/2018    Time  6    Period  Weeks    Status  New      PT LONG TERM GOAL #5   Title  Improve FOTO to </= 29% limitation 05/06/2018    Time  6    Period  Weeks    Status  New             Plan - 03/25/18 1005    Clinical Impression Statement  Jon MaduroRobert presents with several month history of Lt Achille's pain on an intermittent basis. Symptoms are present when he plays basketball and subsides when he rests. He has abnormal posture in standing with Lt LE in ER; limited mobility  through Lt > Rt hip/knee/ankle; muscular tightness in the Lt gastroc/soleus musculature and anterior Lt lateral ankle. Patient will benefit from PT to address problems identified.     History and Personal Factors relevant to plan of care:  3-4 Lt ankle sprains in the past 5 yrs     Clinical Presentation  Evolving    Clinical Decision Making  Low    Rehab Potential  Good    PT Frequency  2x / week    PT Duration  6 weeks    PT Treatment/Interventions  Patient/family education;ADLs/Self Care Home Management;Cryotherapy;Electrical Stimulation;Iontophoresis 4mg /ml Dexamethasone;Moist Heat;Ultrasound;Dry needling;Manual techniques;Neuromuscular re-education;Functional mobility training;Therapeutic activities;Therapeutic exercise    PT Next Visit Plan  assess response to HEP and ionto; taping as needed; progress with stretching and strengthening; modalities as indicated     PT Home Exercise Plan  Access Code: [email or web address]     Consulted and Agree with Plan of Care  Patient       Patient will benefit from skilled therapeutic intervention in order to improve the following deficits and impairments:  Postural dysfunction, Improper body mechanics, Pain, Increased fascial restricitons, Increased muscle spasms, Decreased range of motion, Decreased mobility, Decreased activity tolerance  Visit Diagnosis: Pain in left lower leg - Plan: PT plan of care cert/re-cert  Other symptoms and signs involving the musculoskeletal system - Plan: PT plan of care cert/re-cert     Problem List Patient Active Problem List   Diagnosis Date Noted  . Wears contact lenses 07/11/2017  . Mild persistent asthma without complication 07/11/2017  . Shellfish allergy 07/11/2017  . 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/2017  . Chronic pain of right knee 07/11/2017  . Elevated blood pressure reading 07/11/2017  . Poor vision 09/28/2013  . Chondromalacia of patellofemoral joint 03/23/2012  . 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 ASTHMA  03/05/2010  . DERMATITIS, PERIORAL 03/05/2010  . ACNE VULGARIS, 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    Anan Dapolito Rober MinionP Ayen Viviano PT, MPH  03/25/2018, 12:54 PM  [facility] [address] [place], [place], [address] Phone: [phone]   Fax:  [phone]  Name: Jon Salazar MRN: [id number] Date of Birth: 06/07/1995

## 2018-03-27 ENCOUNTER — Ambulatory Visit (INDEPENDENT_AMBULATORY_CARE_PROVIDER_SITE_OTHER): Payer: PRIVATE HEALTH INSURANCE | Admitting: Physical Therapy

## 2018-03-27 DIAGNOSIS — M79662 Pain in left lower leg: Secondary | ICD-10-CM

## 2018-03-27 DIAGNOSIS — R29898 Other symptoms and signs involving the musculoskeletal system: Secondary | ICD-10-CM | POA: Diagnosis not present

## 2018-03-27 NOTE — Therapy (Signed)
[facility] [place] [address] [place], [place], [address] Phone: [phone]   Fax:  [phone]  Physical Therapy Treatment  Patient Details  Name: Jon Salazar MRN: [id number] Date of Birth: 1995/12/08 Referring Provider (PT): Gena Fray PA-C    Encounter Date: 03/27/2018  PT End of Session - 03/27/18 0941    Visit Number  2    Number of Visits  12    Date for PT Re-Evaluation  05/06/18    PT Start Time  0936    PT Stop Time  1026    PT Time Calculation (min)  50 min - ice pack last 12 min    Activity Tolerance  Patient tolerated treatment well       Past Medical History:  Diagnosis Date  . Allergy   . Asthma   . Lumbar stress fracture     Past Surgical History:  Procedure Laterality Date  . NO PAST SURGERIES    . UMBILICAL HERNIA REPAIR      There were no vitals filed for this visit.  Subjective Assessment - 03/27/18 0941    Subjective  Pt reports he performed the stretches twice since last session; no pain during the stretch, but achey afterwards. He had no skin irritation from patch.      Patient Stated Goals  return to basketball without Lt leg pain     Currently in Pain?  Yes    Pain Score  3     Pain Location  Heel    Pain Orientation  Left;Posterior         [facility] PT Assessment - 03/27/18 0001      Assessment   Medical Diagnosis  Lt Achille's Tendonitis     Referring Provider (PT)  Gena Fray PA-C     Onset Date/Surgical Date  01/02/18    Hand Dominance  Right;Left    Next MD Visit  04/03/2018    Prior Therapy  none        [facility] Adult PT Treatment/Exercise - 03/27/18 0001      Exercises   Exercises  Knee/Hip      Knee/Hip Exercises: Stretches   Passive Hamstring Stretch  Left;Right;3 reps   1 rep seated, 2 reps supine   Quad Stretch  Left;1 rep;30 seconds   standing   ITB Stretch  Left;2 reps;20 seconds   supine w/ strap    Piriformis Stretch  Left;2 reps;30  seconds    Gastroc Stretch  Both;2 reps;30 seconds   incline board   Soleus Stretch  Both;2 reps;30 seconds   incline board     Knee/Hip Exercises: Aerobic   Stationary Bike  L2: 6.5 min       Modalities   Modalities  Cryotherapy;Iontophoresis      Cryotherapy   Number Minutes Cryotherapy  12 Minutes    Cryotherapy Location  Ankle   Lt   Type of Cryotherapy  Ice pack      Iontophoresis   Type of Iontophoresis  Dexamethasone    Location  Lt Achille tendon    Dose  120 mA, 1.0 cc     Time  12 hr patch      Manual Therapy   Manual Therapy  Soft tissue mobilization    Manual therapy comments  pt prone    Soft tissue mobilization  STM including cross fiber friction to Lt Achilles, Lt distal post tib and medial gastroc  PT Long Term Goals - 03/25/18 1244      PT LONG TERM GOAL #1   Title  Decrease pain Lt Achille's and foot/ankle by 75-100% alowing patient to return to all normal functional activities 05/06/2018    Time  6    Period  Weeks    Status  New      PT LONG TERM GOAL #2   Title  Increased tissue extensibility and ROM Lt LE with patient to demonstrate mobility =/> than the Rt LE 05/06/2018    Time  6    Period  Weeks    Status  New      PT LONG TERM GOAL #3   Title  Patient reports return to basketball for 1 to 2 hours 2-3 times/wk 05/06/2018    Time  6    Period  Weeks    Status  New      PT LONG TERM GOAL #4   Title  Independent in HEP 05/06/2018    Time  6    Period  Weeks    Status  New      PT LONG TERM GOAL #5   Title  Improve FOTO to </= 29% limitation 05/06/2018    Time  6    Period  Weeks    Status  New            Plan - 03/27/18 1254    Clinical Impression Statement  Pt continues with tight LE bilat. Pt reported some increase in achey pain in Lt Achilles with Lt supine hamstring stretch.  Some banding in medial side of Lt Achilles with manual therapy.  Ionto patch place over point tender area of Achilles tendon.  Goals are ongoing at  this time.     Rehab Potential  Good    PT Frequency  2x / week    PT Duration  6 weeks    PT Treatment/Interventions  Patient/family education;ADLs/Self Care Home Management;Cryotherapy;Electrical Stimulation;Iontophoresis 4mg /ml Dexamethasone;Moist Heat;Ultrasound;Dry needling;Manual techniques;Neuromuscular re-education;Functional mobility training;Therapeutic activities;Therapeutic exercise    PT Next Visit Plan  assess response to ionto; taping as needed; progress with stretching and strengthening; modalities as indicated     PT Home Exercise Plan  Access Code: [email or web address]     Consulted and Agree with Plan of Care  Patient       Patient will benefit from skilled therapeutic intervention in order to improve the following deficits and impairments:  Postural dysfunction, Improper body mechanics, Pain, Increased fascial restricitons, Increased muscle spasms, Decreased range of motion, Decreased mobility, Decreased activity tolerance  Visit Diagnosis: Pain in left lower leg  Other symptoms and signs involving the musculoskeletal system     Problem List Patient Active Problem List   Diagnosis Date Noted  . Wears contact lenses 07/11/2017  . Mild persistent asthma without complication 07/11/2017  . Shellfish allergy 07/11/2017  . 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/2017  . Chronic pain of right knee 07/11/2017  . Elevated blood pressure reading 07/11/2017  . Poor vision 09/28/2013  . Chondromalacia of patellofemoral joint 03/23/2012  . 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 ASTHMA 03/05/2010  . DERMATITIS, PERIORAL 03/05/2010  . ACNE VULGARIS, 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   Mayer CamelJennifer Carlson-Long, PTA 03/27/18 1:11 PM  [facility] [place] [address] [place], [place], [address] Phone: [phone]   Fax:  [phone]  Name: Jon Salazar MRN: [id number] Date of Birth: 03/01/1996

## 2018-03-30 ENCOUNTER — Ambulatory Visit (INDEPENDENT_AMBULATORY_CARE_PROVIDER_SITE_OTHER): Payer: PRIVATE HEALTH INSURANCE | Admitting: Physical Therapy

## 2018-03-30 DIAGNOSIS — M79662 Pain in left lower leg: Secondary | ICD-10-CM

## 2018-03-30 DIAGNOSIS — R29898 Other symptoms and signs involving the musculoskeletal system: Secondary | ICD-10-CM | POA: Diagnosis not present

## 2018-03-30 NOTE — Therapy (Signed)
[facility] [place] [address] [place], [place], [address] Phone: [phone]   Fax:  [phone]  Physical Therapy Treatment  Patient Details  Name: Jon Salazar MRN: [id number] Date of Birth: 1995-03-29 Referring Provider (PT): Gena Fray PA-C    Encounter Date: 03/30/2018  PT End of Session - 03/30/18 0853    Visit Number  3    Number of Visits  12    Date for PT Re-Evaluation  05/06/18    PT Start Time  0846    PT Stop Time  0937    PT Time Calculation (min)  51 min    Activity Tolerance  Patient tolerated treatment well    Behavior During Therapy  [facility] for tasks assessed/performed       Past Medical History:  Diagnosis Date  . Allergy   . Asthma   . Lumbar stress fracture     Past Surgical History:  Procedure Laterality Date  . NO PAST SURGERIES    . UMBILICAL HERNIA REPAIR      There were no vitals filed for this visit.  Subjective Assessment - 03/30/18 0847    Subjective  Pt reports he is now feeling constant soreness in Achilles tendon since starting therapy.  In past he would feel this pain with ascending steps.  He now has sharp pain in Achilles tendon with pointing foot down with donning pants.     Patient Stated Goals  return to basketball without Lt leg pain     Currently in Pain?  Yes    Pain Score  4     Pain Location  Ankle    Pain Orientation  Left;Posterior    Pain Descriptors / Indicators  Aching;Sharp    Pain Type  Acute pain    Aggravating Factors   basketball; going up stairs    Pain Relieving Factors  OTC meds.          [facility] PT Assessment - 03/30/18 0001      Assessment   Medical Diagnosis  Lt Achille's Tendonitis     Referring Provider (PT)  Gena Fray PA-C     Onset Date/Surgical Date  01/02/18    Hand Dominance  Right;Left    Next MD Visit  04/03/2018    Prior Therapy  none         [facility] Adult PT Treatment/Exercise - 03/30/18 0001      Knee/Hip  Exercises: Stretches   Passive Hamstring Stretch  Left;3 reps;Right;2 reps;30 seconds   standing, foot on12: step   Quad Stretch  Left;1 rep;30 seconds   standing   Gastroc Stretch  Left;Right;2 reps;30 seconds   heel off of step   Soleus Stretch  Both;2 reps;30 seconds   heels off step, one at a time.    Other Knee/Hip Stretches  quad/ant ankle stretch- kneeling sitting back on heels (pillow between buttocks and heels for improved comfort) x 15 sec x 2 reps       Knee/Hip Exercises: Aerobic   Stationary Bike  L2: 5 min       Knee/Hip Exercises: Standing   Step Down  Right;2 sets;10 reps;Hand Hold: 1;Step Height: 4";Step Height: 6"    SLS  Lt SLS x 20-30 sec x 2 reps; repeated on blue pad x 30 sec   Lt ankle circles in between each rep     Modalities   Modalities  Ultrasound;Iontophoresis      Ultrasound   Ultrasound Location  Lt  Achilles tendon     Ultrasound Parameters  100%, 1.1 w/cm2, 8 min     Ultrasound Goals  Pain   tightness     Iontophoresis   Type of Iontophoresis  Dexamethasone    Location  Lt Achille tendon    Dose  120 mA, 1.0 cc     Time  12 hr patch      Manual Therapy   Manual Therapy  Soft tissue mobilization    Manual therapy comments  pt prone    Soft tissue mobilization  STM including cross fiber friction to Lt Achilles, Lt distal post tib and medial gastroc                   PT Long Term Goals - 03/25/18 1244      PT LONG TERM GOAL #1   Title  Decrease pain Lt Achille's and foot/ankle by 75-100% alowing patient to return to all normal functional activities 05/06/2018    Time  6    Period  Weeks    Status  New      PT LONG TERM GOAL #2   Title  Increased tissue extensibility and ROM Lt LE with patient to demonstrate mobility =/> than the Rt LE 05/06/2018    Time  6    Period  Weeks    Status  New      PT LONG TERM GOAL #3   Title  Patient reports return to basketball for 1 to 2 hours 2-3 times/wk 05/06/2018    Time  6    Period  Weeks     Status  New      PT LONG TERM GOAL #4   Title  Independent in HEP 05/06/2018    Time  6    Period  Weeks    Status  New      PT LONG TERM GOAL #5   Title  Improve FOTO to </= 29% limitation 05/06/2018    Time  6    Period  Weeks    Status  New            Plan - 03/30/18 0951    Clinical Impression Statement  Hamstrings remain very tight.  Pt tolerated all exercises well, reported improved comfort of stretch with heels off of step vs runner's stretch.  Encouraged pt to be more consistent with stretches and ice/heat application to assist in reduction of pain and progressing towards goals.   Goals are ongoing at this time.     Rehab Potential  Good    PT Frequency  2x / week    PT Duration  6 weeks    PT Treatment/Interventions  Patient/family education;ADLs/Self Care Home Management;Cryotherapy;Electrical Stimulation;Iontophoresis 4mg /ml Dexamethasone;Moist Heat;Ultrasound;Dry needling;Manual techniques;Neuromuscular re-education;Functional mobility training;Therapeutic activities;Therapeutic exercise    PT Next Visit Plan  progress with stretching and strengthening; modalities as indicated     PT Home Exercise Plan  Access Code: [email or web address]     Consulted and Agree with Plan of Care  Patient       Patient will benefit from skilled therapeutic intervention in order to improve the following deficits and impairments:  Postural dysfunction, Improper body mechanics, Pain, Increased fascial restricitons, Increased muscle spasms, Decreased range of motion, Decreased mobility, Decreased activity tolerance  Visit Diagnosis: Pain in left lower leg  Other symptoms and signs involving the musculoskeletal system     Problem List Patient Active Problem List   Diagnosis Date Noted  . Wears contact lenses 07/11/2017  .  Mild persistent asthma without complication 07/11/2017  . Shellfish allergy 07/11/2017  . 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/2017  . Chronic pain of right knee 07/11/2017  . Elevated  blood pressure reading 07/11/2017  . Poor vision 09/28/2013  . Chondromalacia of patellofemoral joint 03/23/2012  . 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 ASTHMA 03/05/2010  . DERMATITIS, PERIORAL 03/05/2010  . ACNE VULGARIS, 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   Mayer CamelJennifer Carlson-Long, PTA 03/30/18 9:54 AM  [facility] [address] [place], [place], [address] Phone: [phone]   Fax:  [phone]  Name: Jon Salazar MRN: [id number] Date of Birth: 12/20/1995

## 2018-04-01 ENCOUNTER — Ambulatory Visit (INDEPENDENT_AMBULATORY_CARE_PROVIDER_SITE_OTHER): Payer: PRIVATE HEALTH INSURANCE | Admitting: Physical Therapy

## 2018-04-01 ENCOUNTER — Encounter: Payer: Self-pay | Admitting: Physical Therapy

## 2018-04-01 DIAGNOSIS — R29898 Other symptoms and signs involving the musculoskeletal system: Secondary | ICD-10-CM

## 2018-04-01 DIAGNOSIS — M79662 Pain in left lower leg: Secondary | ICD-10-CM

## 2018-04-01 NOTE — Therapy (Signed)
[facility] [place] [address] [place], [place], [address] Phone: [phone]   Fax:  [phone]  Physical Therapy Treatment  Patient Details  Name: Jon Salazar MRN: [id number] Date of Birth: 08/06/1995 Referring Provider (PT): Gena Frayharley Cummings PA-C    Encounter Date: 04/01/2018  PT End of Session - 04/01/18 0857    Visit Number  4    Number of Visits  12    Date for PT Re-Evaluation  05/06/18    PT Start Time  0849    PT Stop Time  0934    PT Time Calculation (min)  45 min    Activity Tolerance  Patient tolerated treatment well    Behavior During Therapy  [facility] for [facility] assessed/performed       Past Medical History:  Diagnosis Date  . Allergy   . Asthma   . Lumbar stress fracture     Past Surgical History:  Procedure Laterality Date  . NO PAST SURGERIES    . UMBILICAL HERNIA REPAIR      There were no vitals filed for this visit.  Subjective Assessment - 04/01/18 0900    Subjective  Pt reports his Lt Achilles is feeling a little better today.  It bothered him some when he was sitting in class yesterday, otherwise it is getting less bothersome.     Patient Stated Goals  return to basketball without Lt leg pain     Currently in Pain?  No/denies         [facility] - 04/01/18 0001      Assessment   Medical Diagnosis  Lt Achille's Tendonitis     Referring Provider (PT)  Gena Frayharley Cummings PA-C     Onset Date/Surgical Date  01/02/18    Hand Dominance  Right;Left    Next MD Visit  04/03/2018    Prior Therapy  none       Flexibility   Hamstrings  Lt 62 deg        [facility] Adult PT Treatment/Exercise - 04/01/18 0001      Knee/Hip Exercises: Stretches   Passive Hamstring Stretch  Left;2 reps;30 seconds   supine with strap; improved.    Quad Stretch  Right;Left;1 rep;30 seconds   standing   Gastroc Stretch  Left;Right;2 reps;30 seconds   heel off of step   Soleus Stretch  Both;2 reps;30  seconds   heels off step, one at a time.      Knee/Hip Exercises: Aerobic   Elliptical  L3: 4 min       Knee/Hip Exercises: Standing   Forward Step Up  Left;1 set;10 reps;Hand Hold: 0;Step Height: 8"   eccentric retro lowering   Forward Step Up Limitations  initially unable to control descent; demo and cues for improved form.  repeated with UE support on Bosu x 10     SLS  Lt/Rt SLS on bosu x 30 sec; repeated on upside bosu x 30 sec  (very challenging on LLE)    Rebounder  small hops on rebounder x 10, then gentle single leg hops x 10 reps each side (no pain) then rocking back and forth in staggered stance x 10 each leg forward.       Ultrasound   Ultrasound Location  Lt Achilles tendon    Ultrasound Parameters  100% 1.1w/cm2, 8 min     Ultrasound Goals  Pain   tightness     Iontophoresis   Type of Iontophoresis  Dexamethasone  Location  Lt Achille tendon    Dose  80 mA stat patch, 1.0 cc     Time  4 hr patch                  PT Long Term Goals - 03/25/18 1244      PT LONG TERM GOAL #1   Title  Decrease pain Lt Achille's and foot/ankle by 75-100% alowing patient to return to all normal functional activities 05/06/2018    Time  6    Period  Weeks    Status  New      PT LONG TERM GOAL #2   Title  Increased tissue extensibility and ROM Lt LE with patient to demonstrate mobility =/> than the Rt LE 05/06/2018    Time  6    Period  Weeks    Status  New      PT LONG TERM GOAL #3   Title  Patient reports return to basketball for 1 to 2 hours 2-3 times/wk 05/06/2018    Time  6    Period  Weeks    Status  New      PT LONG TERM GOAL #4   Title  Independent in HEP 05/06/2018    Time  6    Period  Weeks    Status  New      PT LONG TERM GOAL #5   Title  Improve FOTO to </= 29% limitation 05/06/2018    Time  6    Period  Weeks    Status  New            Plan - 04/01/18 0904    Clinical Impression Statement  Pt able to tolerate 10 reps of heel raises without any  discomfort; improved from last visit. Pt also able to tolerate light jumps on rebounder. Hamstrings remain tight. Less reported soreness with palpation to Achilles. Positive response to manual therapy and ionto treatment.  Pt gradually progressing towards goals.     Rehab Potential  Good    PT Frequency  2x / week    PT Duration  6 weeks    PT Treatment/Interventions  Patient/family education;ADLs/Self Care Home Management;Cryotherapy;Electrical Stimulation;Iontophoresis 4mg /ml Dexamethasone;Moist Heat;Ultrasound;Dry needling;Manual techniques;Neuromuscular re-education;Functional mobility training;Therapeutic activities;Therapeutic exercise    PT Next Visit Plan  progress with stretching and strengthening; modalities as indicated     PT Home Exercise Plan  Access Code: [email or web address]        Patient will benefit from skilled therapeutic intervention in order to improve the following deficits and impairments:  Postural dysfunction, Improper body mechanics, Pain, Increased fascial restricitons, Increased muscle spasms, Decreased range of motion, Decreased mobility, Decreased activity tolerance  Visit Diagnosis: Pain in left lower leg  Other symptoms and signs involving the musculoskeletal system     Problem List Patient Active Problem List   Diagnosis Date Noted  . Wears contact lenses 07/11/2017  . Mild persistent asthma without complication 07/11/2017  . Shellfish allergy 07/11/2017  . 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/2017  . Chronic pain of right knee 07/11/2017  . Elevated blood pressure reading 07/11/2017  . Poor vision 09/28/2013  . Chondromalacia of patellofemoral joint 03/23/2012  . 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 ASTHMA 03/05/2010  . DERMATITIS, PERIORAL 03/05/2010  . ACNE VULGARIS, 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   Mayer Camel, PTA 04/01/18 1:23 PM  [facility] [place] [address] [place], [place], [address] Phone: [phone]    Fax:  [phone]  Name: Jon Salazar MRN: [id number] Date of Birth: 12/16/1995

## 2018-04-03 ENCOUNTER — Encounter: Payer: Self-pay | Admitting: Sports Medicine

## 2018-04-03 ENCOUNTER — Ambulatory Visit (INDEPENDENT_AMBULATORY_CARE_PROVIDER_SITE_OTHER): Payer: PRIVATE HEALTH INSURANCE | Admitting: Sports Medicine

## 2018-04-03 DIAGNOSIS — M79672 Pain in left foot: Secondary | ICD-10-CM

## 2018-04-03 MED ORDER — NITROGLYCERIN 0.2 MG/HR TD PT24: MEDICATED_PATCH | TRANSDERMAL | 11 refills | Status: DC

## 2018-04-03 MED ORDER — MELOXICAM 15 MG PO TABS: ORAL_TABLET | ORAL | 3 refills | Status: DC

## 2018-04-03 NOTE — Assessment & Plan Note (Signed)
Suspect calcaneal bursitis versus insertional Achilles tendinopathy. Meloxicam, topical nitroglycerin patches. #3 Heel lifts given today. Continue physical therapy. Return in 1 month, if persistent discomfort we will do an injection followed by boot for a week considering proximity to the Achilles.

## 2018-04-03 NOTE — Progress Notes (Signed)
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  For the last 2 months he is had pain he localizes in the posterior aspect of his left heel.  Moderate, persistent, localized without radiation.  He tried some physical therapy but symptoms only worsened.  No trauma.  I reviewed the past medical history, family history, social history, surgical history, and allergies today and no changes were needed.  Please see the problem list section below in epic for further details.  Past Medical History: Past Medical History:  Diagnosis Date  . Allergy   . Asthma   . Lumbar stress fracture    Past Surgical History: Past Surgical History:  Procedure Laterality Date  . NO PAST SURGERIES    . UMBILICAL HERNIA REPAIR     Social History: Social History   Socioeconomic History  . Marital status: Single    Spouse name: Not on file  . Number of children: Not on file  . Years of education: Not on file  . Highest education level: Not on file  Occupational History  . Occupation: Consulting civil engineer  Social Needs  . Financial resource strain: Not on file  . Food insecurity:    Worry: Not on file    Inability: Not on file  . Transportation needs:    Medical: Not on file    Non-medical: Not on file  Tobacco Use  . Smoking status: Never Smoker  . Smokeless tobacco: Never Used  Substance and Sexual Activity  . Alcohol use: No  . Drug use: No  . Sexual activity: Never  Lifestyle  . Physical activity:    Days per week: Not on file    Minutes per session: Not on file  . Stress: Not on file  Relationships  . Social connections:    Talks on phone: Not on file    Gets together: Not on file    Attends religious service: Not on file    Active member of club or organization: Not on file    Attends meetings of clubs or organizations: Not on file    Relationship status: Not on file    Other Topics Concern  . Not on file  Social History Narrative  . Not on file   Family History: Family History  Problem Relation Age of Onset  . Hypertension Mother    Allergies: Allergies  Allergen Reactions  . Peanut-Containing Drug Products   . Shellfish Allergy    Medications: See med rec.  Review of Systems: No headache, visual changes, nausea, vomiting, diarrhea, constipation, dizziness, abdominal pain, skin rash, fevers, chills, night sweats, weight loss, swollen lymph nodes, body aches, joint swelling, muscle aches, chest pain, shortness of breath, mood changes, visual or auditory hallucinations.   Objective:   General: Well Developed, well nourished, and in no acute distress.  Neuro:  Extra-ocular muscles intact, able to move all 4 extremities, sensation grossly intact.  Deep tendon reflexes tested were normal. Psych: Alert and oriented, mood congruent with affect. ENT:  Ears and nose appear unremarkable.  Hearing grossly normal. Neck: Unremarkable overall appearance, trachea midline.  No visible thyroid enlargement. Eyes: Conjunctivae and lids appear unremarkable.  Pupils equal and round. Skin: Warm and dry, no rashes noted.  Cardiovascular: Pulses palpable, no extremity edema. Left foot: No visible erythema or swelling. Range of motion is full in all directions. Strength is 5/5 in all  directions. No hallux valgus. No pes cavus or pes planus. No abnormal callus noted. No pain over the navicular prominence, or base of fifth metatarsal. No tenderness to palpation of the calcaneal insertion of plantar fascia. Moderate pain at the Achilles insertion. No pain over the calcaneal bursa. Moderate pain of the retrocalcaneal bursa. No tenderness to palpation over the tarsals, metatarsals, or phalanges. No hallux rigidus or limitus. No tenderness palpation over interphalangeal joints. No pain with compression of the metatarsal heads. Neurovascularly intact  distally.  Impression and Recommendations:   This case required medical decision making of moderate complexity.  Pain of left heel Suspect calcaneal bursitis versus insertional Achilles tendinopathy. Meloxicam, topical nitroglycerin patches. #3 Heel lifts given today. Continue physical therapy. Return in 1 month, if persistent discomfort we will do an injection followed by boot for a week considering proximity to the Achilles. ___________________________________________ Ihor Austin. Benjamin Stain, M.D., ABFM., CAQSM. Primary Care and Sports Medicine [facility]  Adjunct Professor of Family Medicine  [employer]

## 2018-04-06 ENCOUNTER — Encounter: Payer: Self-pay | Admitting: Physical Therapy

## 2018-04-06 ENCOUNTER — Ambulatory Visit (INDEPENDENT_AMBULATORY_CARE_PROVIDER_SITE_OTHER): Payer: PRIVATE HEALTH INSURANCE | Admitting: Physical Therapy

## 2018-04-06 DIAGNOSIS — M79662 Pain in left lower leg: Secondary | ICD-10-CM

## 2018-04-06 DIAGNOSIS — R29898 Other symptoms and signs involving the musculoskeletal system: Secondary | ICD-10-CM | POA: Diagnosis not present

## 2018-04-06 NOTE — Therapy (Signed)
[facility] [place] [address] [place], [place], [address] Phone: [phone]   Fax:  [phone]  Physical Therapy Treatment  Patient Details  Name: Jon Salazar MRN: [id number] Date of Birth: [birth year] Referring Provider (PT): Gena Fray PA-C    Encounter Date: 04/06/2018  PT End of Session - 04/06/18 1320    Visit Number  5    Number of Visits  12    Date for PT Re-Evaluation  05/06/18    PT Start Time  0847    PT Stop Time  0940   ice pack last 12 min    PT Time Calculation (min)  53 min    Activity Tolerance  Patient tolerated treatment well    Behavior During Therapy  [facility] for tasks assessed/performed       Past Medical History:  Diagnosis Date  . Allergy   . Asthma   . Lumbar stress fracture     Past Surgical History:  Procedure Laterality Date  . NO PAST SURGERIES    . UMBILICAL HERNIA REPAIR      There were no vitals filed for this visit.  Subjective Assessment - 04/06/18 0854    Subjective  Pt reports he went to Dr. on Friday.  He was told he has tendinitis and was prescribed nitro patch to apply to his Achilles. He has tried the patch and has not had any other symptoms systemically.  He went to gym to shoot baskets and had an occasional sharp pain in lateral Lt ankle but no increased pain in Achilles.     Patient Stated Goals  return to basketball without Lt leg pain     Currently in Pain?  No/denies    Pain Score  0-No pain         [facility] PT Assessment - 04/06/18 0001      Assessment   Medical Diagnosis  Lt Achille's Tendonitis     Referring Provider (PT)  Gena Fray PA-C     Onset Date/Surgical Date  01/02/18    Hand Dominance  Right;Left    Next MD Visit  05/01/18    Prior Therapy  none       Flexibility   Hamstrings  Lt 65 deg        [facility] Adult PT Treatment/Exercise - 04/06/18 0001      Knee/Hip Exercises: Stretches   Passive Hamstring Stretch  Left;2 reps;30  seconds   supine with strap; improved.    Piriformis Stretch  Left;2 reps;Right;1 rep;30 seconds    Gastroc Stretch  Left;Right;2 reps;30 seconds   heel off of step   Soleus Stretch  Both;2 reps;30 seconds   heels off step, one at a time.      Knee/Hip Exercises: Aerobic   Elliptical  L3: 5 min       Knee/Hip Exercises: Standing   Heel Raises  Both;1 set;10 reps   heels off of step; no pain   Forward Step Up  Left;1 set;10 reps;Hand Hold: 0;Step Height: 8"   eccentric retro lowering   SLS  Lt/Rt SLS on upside down bosu x 30 sec. (improved on Lt); SLS forward leans to touch chair with same side hand, x 10 reps for LLE x 5 reps on RLE.        Cryotherapy   Number Minutes Cryotherapy  12 Minutes    Cryotherapy Location  Ankle   Lt   Type of Cryotherapy  Ice pack  Iontophoresis   Type of Iontophoresis  --   held due to nitro patch in same area     Ankle Exercises: Seated   Other Seated Ankle Exercises  bilat ankle eversion with green x 20 reps;  Lt ankle DF with green band, eccentric control x 10 reps             PT Education - 04/06/18 1326    Education Details  updated HEP. issued green band.     Person(s) Educated  Patient    Methods  Explanation;Handout;Verbal cues    Comprehension  Verbalized understanding;Returned demonstration          PT Long Term Goals - 04/06/18 0922      PT LONG TERM GOAL #1   Title  Decrease pain Lt Achille's and foot/ankle by 75-100% alowing patient to return to all normal functional activities 05/06/2018    Baseline  has tried to play basketball yet (source of pain)    Time  6    Period  Weeks    Status  On-going      PT LONG TERM GOAL #2   Title  Increased tissue extensibility and ROM Lt LE with patient to demonstrate mobility =/> than the Rt LE 05/06/2018    Baseline  improving    Time  6    Period  Weeks    Status  On-going      PT LONG TERM GOAL #3   Title  Patient reports return to basketball for 1 to 2 hours 2-3  times/wk 05/06/2018    Time  6    Period  Weeks    Status  On-going      PT LONG TERM GOAL #4   Title  Independent in HEP 05/06/2018    Time  6    Period  Weeks    Status  On-going      PT LONG TERM GOAL #5   Title  Improve FOTO to </= 29% limitation 05/06/2018    Time  6    Period  Weeks    Status  On-going            Plan - 04/06/18 [id number]    Clinical Impression Statement  Pt was able to tolerate heel raises with eccentric lowering without any discomfort.  Pt continues to demonstrate functional weakness throughout LLE, noted with resisted ankle exercises and single leg exercises.  Pt is making gradual gains towards all goals.  Pt has not tried to play a basketball game yet, so he is unsure if his pain has changed (since he was only having pain after completing games.).      Rehab Potential  Good    PT Frequency  2x / week    PT Duration  6 weeks    PT Treatment/Interventions  Patient/family education;ADLs/Self Care Home Management;Cryotherapy;Electrical Stimulation;Iontophoresis 4mg /ml Dexamethasone;Moist Heat;Ultrasound;Dry needling;Manual techniques;Neuromuscular re-education;Functional mobility training;Therapeutic activities;Therapeutic exercise    PT Next Visit Plan  progress with stretching and strengthening; modalities as indicated     PT Home Exercise Plan  Access Code: [email or web address]     Consulted and Agree with Plan of Care  Patient       Patient will benefit from skilled therapeutic intervention in order to improve the following deficits and impairments:  Postural dysfunction, Improper body mechanics, Pain, Increased fascial restricitons, Increased muscle spasms, Decreased range of motion, Decreased mobility, Decreased activity tolerance  Visit Diagnosis: Pain in left lower leg  Other symptoms and signs involving the musculoskeletal  system     Problem List Patient Active Problem List   Diagnosis Date Noted  . Pain of left heel 04/03/2018  . Wears contact lenses  07/11/2017  . Mild persistent asthma without complication 07/11/2017  . Shellfish allergy 07/11/2017  . 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/2017  . Chronic pain of right knee 07/11/2017  . Elevated blood pressure reading 07/11/2017  . Poor vision 09/28/2013  . Chondromalacia of patellofemoral joint 03/23/2012  . 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 ASTHMA 03/05/2010  . DERMATITIS, PERIORAL 03/05/2010  . ACNE VULGARIS, 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   Mayer CamelJennifer Carlson-Long, PTA 04/06/18 2:08 PM  [facility] [address] [place], [place], [address] Phone: [phone]   Fax:  [phone]  Name: Gaylyn RongRobert L Lacorte II MRN: [id number] Date of Birth: 05/02/1995

## 2018-04-06 NOTE — Patient Instructions (Signed)
Access Code: [email or web address]  URL: [email or web address]  Date: 04/06/2018  Prepared by: Mayer Camel   Exercises  Supine Hamstring Stretch with Strap - 10 reps - 1 sets - 30 seconds hold - 2x daily - 7x weekly  Supine Piriformis Stretch with Leg Straight - 3 reps - 1 sets - 30 seconds hold - 2x daily - 7x weekly  Prone Quadriceps Stretch with Strap - 3 reps - 1 sets - 30 seconds hold - 2x daily - 7x weekly  Gastroc Stretch on Wall - 3 reps - 1 sets - 30 sec hold - 2x daily - 7x weekly  Standing Soleus Stretch - 3 reps - 1 sets - 30 sec hold - 2x daily - 7x weekly  Forward T - 10 reps - 1-2 sets - 1x daily - 4x weekly  Seated Anterior Tibialis Stretch - 3 reps - 1 sets - 30 sec hold - 2x daily - 7x weekly  Heel Raise on Step - 10 reps - 2 sets - 1x daily - 4x weekly  Long Sitting Ankle Dorsiflexion with Anchored Resistance - 10 reps - 2 sets - 1x daily - 4x weekly  Seated Ankle Eversion with Resistance - 10 reps - 2 sets - 1x daily - 4x weekly

## 2018-04-08 ENCOUNTER — Ambulatory Visit (INDEPENDENT_AMBULATORY_CARE_PROVIDER_SITE_OTHER): Payer: PRIVATE HEALTH INSURANCE | Admitting: Physical Therapy

## 2018-04-08 ENCOUNTER — Encounter: Payer: Self-pay | Admitting: Physical Therapy

## 2018-04-08 DIAGNOSIS — R29898 Other symptoms and signs involving the musculoskeletal system: Secondary | ICD-10-CM

## 2018-04-08 DIAGNOSIS — M79662 Pain in left lower leg: Secondary | ICD-10-CM

## 2018-04-08 NOTE — Therapy (Signed)
[facility] [address] [place], [place], [address] Phone: [phone]   Fax:  [phone]  Physical Therapy Treatment  Patient Details  Name: Jon Salazar MRN: [id number] Date of Birth: Sep 05, 1995 Referring Provider (PT): Gena Fray PA-C    Encounter Date: 04/08/2018  PT End of Session - 04/08/18 0857    Visit Number  6    Number of Visits  12    Date for PT Re-Evaluation  05/06/18    PT Start Time  0849    PT Stop Time  0930   ice massage last 3 min    PT Time Calculation (min)  41 min    Activity Tolerance  Patient tolerated treatment well;No increased pain    Behavior During Therapy  WFL for tasks assessed/performed       Past Medical History:  Diagnosis Date  . Allergy   . Asthma   . Lumbar stress fracture     Past Surgical History:  Procedure Laterality Date  . NO PAST SURGERIES    . UMBILICAL HERNIA REPAIR      There were no vitals filed for this visit.  Subjective Assessment - 04/08/18 0858    Subjective  Pt reports he is having pain in his Achilles less often, but still has occasional twinges when sitting in class and after stretching.  He has not tried playing basketball yet.  He is wearing nitro patches daily as prescribed.    Pertinent History  several Lt ankle sprains - last time in June 2019 - has sprained lt ankle 3-4 times in the past 5 yrs     Patient Stated Goals  return to basketball without Lt leg pain     Currently in Pain?  No/denies    Pain Score  0-No pain         [facility] PT Assessment - 04/08/18 0001      Assessment   Medical Diagnosis  Lt Achille's Tendonitis     Referring Provider (PT)  Gena Fray PA-C     Onset Date/Surgical Date  01/02/18    Hand Dominance  Right;Left    Next MD Visit  05/01/18    Prior Therapy  none        [facility] Adult PT Treatment/Exercise - 04/08/18 0001      Knee/Hip Exercises: Stretches   Gastroc Stretch  Right;Left;30  seconds;3 reps    Soleus Stretch  Right;Left;2 reps;30 seconds      Knee/Hip Exercises: Aerobic   Elliptical  L3: 4 min (2 min forward/ 2 min backward)      Knee/Hip Exercises: Standing   Heel Raises  Both;10 reps;2 sets   heels off of step; no pain. toes straight, toes out.    Lateral Step Up Limitations  Heel taps to step x 10 each leg    SLS  Lt/Rt SLS on Bosu x 30 sec, repeated on upside down bosu.; SLS forward leans to touch chair with same side hand, x 10 reps for LLE x 5 reps on RLE.      Other Standing Knee Exercises  Light agility:  side shuffling 25 ft Lt/Rt, slow speed then medium speed x 3 reps, light skipping x 25 feet x 4 reps; light jog x 25 ft x 4 reps, then light jog with simulated shooting (basketball) x 2 reps, (single laps walking around gym in between different exercises)       Cryotherapy   Number Minutes Cryotherapy  3 Minutes  Cryotherapy Location  Ankle    Type of Cryotherapy  Ice massage         PT Long Term Goals - 04/06/18 [id number]      PT LONG TERM GOAL #1   Title  Decrease pain Lt Achille's and foot/ankle by 75-100% alowing patient to return to all normal functional activities 05/06/2018    Baseline  hasn't tried to play basketball yet (source of pain)    Time  6    Period  Weeks    Status  On-going      PT LONG TERM GOAL #2   Title  Increased tissue extensibility and ROM Lt LE with patient to demonstrate mobility =/> than the Rt LE 05/06/2018    Baseline  improving    Time  6    Period  Weeks    Status  On-going      PT LONG TERM GOAL #3   Title  Patient reports return to basketball for 1 to 2 hours 2-3 times/wk 05/06/2018    Time  6    Period  Weeks    Status  On-going      PT LONG TERM GOAL #4   Title  Independent in HEP 05/06/2018    Time  6    Period  Weeks    Status  On-going      PT LONG TERM GOAL #5   Title  Improve FOTO to </= 29% limitation 05/06/2018    Time  6    Period  Weeks    Status  On-going            Plan - 04/08/18  0930    Clinical Impression Statement  Pt able to tolerate standing strengthening and proprioceptive exercises without difficulty.  He was able to tolerate a brief trial of light agility exercises without any increase in symptoms.  Focus has been to strengthen whole kinetic chain and continue LE stretches. Pt reporting overall decrease in episodes of pain in Lt Achilles and plans to try playing some light basketball today.  Progressing towards goals.     Rehab Potential  Good    PT Frequency  2x / week    PT Duration  6 weeks    PT Treatment/Interventions  Patient/family education;ADLs/Self Care Home Management;Cryotherapy;Electrical Stimulation;Iontophoresis 4mg /ml Dexamethasone;Moist Heat;Ultrasound;Dry needling;Manual techniques;Neuromuscular re-education;Functional mobility training;Therapeutic activities;Therapeutic exercise    PT Next Visit Plan  progress with stretching and strengthening; modalities as indicated     PT Home Exercise Plan  Access Code: [email or web address]     Consulted and Agree with Plan of Care  Patient       Patient will benefit from skilled therapeutic intervention in order to improve the following deficits and impairments:  Postural dysfunction, Improper body mechanics, Pain, Increased fascial restricitons, Increased muscle spasms, Decreased range of motion, Decreased mobility, Decreased activity tolerance  Visit Diagnosis: Pain in left lower leg  Other symptoms and signs involving the musculoskeletal system     Problem List Patient Active Problem List   Diagnosis Date Noted  . Pain of left heel 04/03/2018  . Wears contact lenses 07/11/2017  . Mild persistent asthma without complication 07/11/2017  . Shellfish allergy 07/11/2017  . 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/2017  . Chronic pain of right knee 07/11/2017  . Elevated blood pressure reading 07/11/2017  . Poor vision 09/28/2013  . Chondromalacia of patellofemoral joint 03/23/2012  . 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   . ASTHMA 03/05/2010  . DERMATITIS, PERIORAL 03/05/2010  .  ACNE VULGARIS, 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   Mayer Camel, PTA 04/08/18 9:50 AM  [facility] [address] [place], [place], [address] Phone: [phone]   Fax:  [phone]  Name: PACER WEHR MRN: [id number] Date of Birth: [birth year]

## 2018-04-13 ENCOUNTER — Encounter: Payer: Self-pay | Admitting: Rehabilitative and Restorative Service Providers"

## 2018-04-13 ENCOUNTER — Ambulatory Visit (INDEPENDENT_AMBULATORY_CARE_PROVIDER_SITE_OTHER): Payer: PRIVATE HEALTH INSURANCE | Admitting: Rehabilitative and Restorative Service Providers"

## 2018-04-13 DIAGNOSIS — R29898 Other symptoms and signs involving the musculoskeletal system: Secondary | ICD-10-CM | POA: Diagnosis not present

## 2018-04-13 DIAGNOSIS — M79662 Pain in left lower leg: Secondary | ICD-10-CM

## 2018-04-13 NOTE — Therapy (Signed)
[facility] [place] [place] [address] [place], [place], [address] Phone: [phone]   Fax:  [phone]  Physical Therapy Treatment  Patient Details  Name: Jon Salazar MRN: [id number] Date of Birth: 27-Mar-1995 Referring Provider (PT): Gena Fray PA-C    Encounter Date: 04/13/2018  PT End of Session - 04/13/18 0851    Visit Number  7    Number of Visits  12    Date for PT Re-Evaluation  05/06/18    PT Start Time  0845    PT Stop Time  0932    PT Time Calculation (min)  47 min    Activity Tolerance  Patient tolerated treatment well       Past Medical History:  Diagnosis Date  . Allergy   . Asthma   . Lumbar stress fracture     Past Surgical History:  Procedure Laterality Date  . NO PAST SURGERIES    . UMBILICAL HERNIA REPAIR      There were no vitals filed for this visit.  Subjective Assessment - 04/13/18 0852    Subjective  Played basketball for about an hour since last visit and did well. Did use ankle brace but did not experience pain while playing or after playing - just some soreness in ankle as well as in legs. Pleased with progress.     Currently in Pain?  No/denies                       [facility] Adult PT Treatment/Exercise - 04/13/18 0001      Knee/Hip Exercises: Stretches   Passive Hamstring Stretch  Left;Right;2 reps;30 seconds   supine with strap    Piriformis Stretch  Left;2 reps;Right;1 rep;30 seconds   supine travell - (+diagonal knee to chest x 2 reps x 30 sec    Gastroc Stretch  Right;Left;30 seconds;3 reps    Soleus Stretch  Right;Left;2 reps;30 seconds    Other Knee/Hip Stretches  repeated gastroc/soleus stretches on slant board 30 sec x 2 reps each       Knee/Hip Exercises: Aerobic   Elliptical  L3: 6 min       Knee/Hip Exercises: Standing   Heel Raises  Both;10 reps;2 sets   on floor - repeated w/Lt LE only 10 reps x 2 sets    Forward Step Up  Left;2 sets;10  reps;Hand Hold: 1;Step Height: 6"   stepping trough with Rt to touch next step then back    SLS  Lt/Rt SLS on Bosu x 30 sec, repeated on upside down bosu.; SLS forward leans to touch chair with same side hand, x 10 reps for LLE x 5 reps on RLE.      SLS with Vectors  rebounder standing forward/toward each side x 10 x 2 sets each     Rebounder  marching; jogging; bouncing; running 2-3 min     Other Standing Knee Exercises  bounding 25 ft x 6 reps       Knee/Hip Exercises: Seated   Sit to Sand  3 sets;without UE support   Lt foot back moving in small range up/down 30 sec      Cryotherapy   Number Minutes Cryotherapy  4 Minutes    Cryotherapy Location  Ankle   posterior - achilles area    Type of Cryotherapy  Ice massage                  PT Long Term Goals -  04/06/18 0922      PT LONG TERM GOAL #1   Title  Decrease pain Lt Achille's and foot/ankle by 75-100% alowing patient to return to all normal functional activities 05/06/2018    Baseline  has tried to play basketball yet (source of pain)    Time  6    Period  Weeks    Status  On-going      PT LONG TERM GOAL #2   Title  Increased tissue extensibility and ROM Lt LE with patient to demonstrate mobility =/> than the Rt LE 05/06/2018    Baseline  improving    Time  6    Period  Weeks    Status  On-going      PT LONG TERM GOAL #3   Title  Patient reports return to basketball for 1 to 2 hours 2-3 times/wk 05/06/2018    Time  6    Period  Weeks    Status  On-going      PT LONG TERM GOAL #4   Title  Independent in HEP 05/06/2018    Time  6    Period  Weeks    Status  On-going      PT LONG TERM GOAL #5   Title  Improve FOTO to </= 29% limitation 05/06/2018    Time  6    Period  Weeks    Status  On-going            Plan - 04/13/18 [id number]    Clinical Impression Statement  Progressing well with rehab - reporting return to sports playing basketball for ~ 1 hour with soreness but no increase in pain. Cotninues to work on  stretching and strengthening as well as agility. Progressing well toward stated goals of therapy.     Rehab Potential  Good    PT Frequency  2x / week    PT Duration  6 weeks    PT Treatment/Interventions  Patient/family education;ADLs/Self Care Home Management;Cryotherapy;Electrical Stimulation;Iontophoresis 4mg /ml Dexamethasone;Moist Heat;Ultrasound;Dry needling;Manual techniques;Neuromuscular re-education;Functional mobility training;Therapeutic activities;Therapeutic exercise    PT Next Visit Plan  progress with stretching and strengthening; modalities as indicated     PT Home Exercise Plan  Access Code: [email or web address]     Consulted and Agree with Plan of Care  Patient       Patient will benefit from skilled therapeutic intervention in order to improve the following deficits and impairments:  Postural dysfunction, Improper body mechanics, Pain, Increased fascial restricitons, Increased muscle spasms, Decreased range of motion, Decreased mobility, Decreased activity tolerance  Visit Diagnosis: Pain in left lower leg  Other symptoms and signs involving the musculoskeletal system     Problem List Patient Active Problem List   Diagnosis Date Noted  . Pain of left heel 04/03/2018  . Wears contact lenses 07/11/2017  . Mild persistent asthma without complication 07/11/2017  . Shellfish allergy 07/11/2017  . 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/2017  . Chronic pain of right knee 07/11/2017  . Elevated blood pressure reading 07/11/2017  . Poor vision 09/28/2013  . Chondromalacia of patellofemoral joint 03/23/2012  . 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 ASTHMA 03/05/2010  . DERMATITIS, PERIORAL 03/05/2010  . ACNE VULGARIS, 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    Jakolby Sedivy Rober MinionP Haydin Dunn PT, MPH  04/13/2018, 10:00 AM  [facility] [address] [place], [place], [address] Phone: [phone]   Fax:  [phone]  Name: Jon RongRobert L Blank Salazar MRN: [id number] Date of  Birth: 01/08/1996

## 2018-04-15 ENCOUNTER — Ambulatory Visit (INDEPENDENT_AMBULATORY_CARE_PROVIDER_SITE_OTHER): Payer: PRIVATE HEALTH INSURANCE | Admitting: Physical Therapy

## 2018-04-15 DIAGNOSIS — M79662 Pain in left lower leg: Secondary | ICD-10-CM | POA: Diagnosis not present

## 2018-04-15 DIAGNOSIS — R29898 Other symptoms and signs involving the musculoskeletal system: Secondary | ICD-10-CM

## 2018-04-15 NOTE — Therapy (Signed)
[place], [place], [address] Phone: [phone]   Fax:  [phone]  Physical Therapy Treatment  Patient Details  Name: Jon Salazar MRN: [id number] Date of Birth: Oct 25, 1995 Referring Provider (PT): Nelson Chimes PA-C    Encounter Date: 04/15/2018  PT End of Session - 04/15/18 0859    Visit Number  8    Number of Visits  12    Date for PT Re-Evaluation  05/06/18    PT Start Time  0844    PT Stop Time  0943   Ice pack last 12 min    PT Time Calculation (min)  59 min    Activity Tolerance  Patient tolerated treatment well    Behavior During Therapy  [facility] for tasks assessed/performed       Past Medical History:  Diagnosis Date  . Allergy   . Asthma   . Lumbar stress fracture     Past Surgical History:  Procedure Laterality Date  . NO PAST SURGERIES    . UMBILICAL HERNIA REPAIR      There were no vitals filed for this visit.  Subjective Assessment - 04/15/18 0846    Subjective  Played basketball for about an hour since last visit with ankle brace.  He states he had some ant Lt ankle pain afterwards; resolved with ice and rest.  He reports his Achilles was sore afterwards, but minimal compared to prior to therapy.  but did not experience pain while playing or after playing - just some soreness in ankle as well as in legs. Pleased with progress.    Pertinent History  several Lt ankle sprains - last time in June 2019 - has sprained lt ankle 3-4 times in the past 5 yrs     Patient Stated Goals  return to basketball without Lt leg pain     Currently in Pain?  No/denies         [facility] PT Assessment - 04/15/18 0001      Assessment   Medical Diagnosis  Lt Achille's Tendonitis     Referring Provider (PT)  Nelson Chimes PA-C     Onset Date/Surgical Date  01/02/18    Hand Dominance  Right;Left    Next MD Visit  05/01/18    Prior Therapy  none       [facility] Adult PT  Treatment/Exercise - 04/15/18 0001      Knee/Hip Exercises: Stretches   Passive Hamstring Stretch  Left;Right;2 reps;30 seconds   supine with strap    Piriformis Stretch  Left;2 reps;Right;1 rep;30 seconds    Gastroc Stretch  Right;Left;30 seconds;3 reps   incline board   Soleus Stretch  Right;Left;2 reps;30 seconds      Knee/Hip Exercises: Aerobic   Elliptical  L3: 5 min, 2 min backward       Knee/Hip Exercises: Standing   SLS  Lt/Rt SLS on Bosu x 30 sec, x 2 ; SLS forward leans to touch cone on step with same side hand, x 10 reps each side.    Other Standing Knee Exercises  rebounder, single leg jumps with and without support (pt voices fear of ankle rolling laterally with this exercise) x 10 L, x 5 R, 2 sets     Other Standing Knee Exercises  side shuffle with med speed x 25 ft Rt/Lt x 3 reps. single leg stance with pivoting off opposite foot to simulate offense in basketball (performed on Rt/Lt side)  Cryotherapy   Number Minutes Cryotherapy  12 Minutes    Cryotherapy Location  Ankle   Lt   Type of Cryotherapy  Ice pack      Ankle Exercises: Standing   BAPS  Standing;Level 2   PF/DF, CW/CCW x 10 each with UE support     Ankle Exercises: Seated   Other Seated Ankle Exercises  Lt ankle eversion with blue band x 10 reps        PT Long Term Goals - 04/15/18 0855      PT LONG TERM GOAL #1   Title  Decrease pain Lt Achille's and foot/ankle by 75-100% alowing patient to return to all normal functional activities 05/06/2018    Time  6    Period  Weeks    Status  Achieved      PT LONG TERM GOAL #2   Title  Increased tissue extensibility and ROM Lt LE with patient to demonstrate mobility =/> than the Rt LE 05/06/2018    Baseline  improving    Time  6    Period  Weeks    Status  On-going      PT LONG TERM GOAL #3   Title  Patient reports return to basketball for 1 to 2 hours 2-3 times/wk 05/06/2018    Time  6    Period  Weeks    Status  Partially Met      PT LONG TERM  GOAL #4   Title  Independent in HEP 05/06/2018    Time  6    Period  Weeks    Status  On-going      PT LONG TERM GOAL #5   Title  Improve FOTO to </= 29% limitation 05/06/2018    Time  6    Period  Weeks    Status  On-going            Plan - 04/15/18 0900    Clinical Impression Statement  Pt reporting more than 75% reduction of pain in Lt ankle with playing basketball; has met LTG#1.  He reported some increase in Lt ankle irritation (around ATF lig and peroneus tertius insertion) with standing BAPS board and single leg hops on rebounder; discomfort reduced with rest and later with ice application. All other exercises were tolerated well.  Pt progressing towards goals.     Rehab Potential  Good    PT Frequency  2x / week    PT Duration  6 weeks    PT Treatment/Interventions  Patient/family education;ADLs/Self Care Home Management;Cryotherapy;Electrical Stimulation;Iontophoresis 21m/ml Dexamethasone;Moist Heat;Ultrasound;Dry needling;Manual techniques;Neuromuscular re-education;Functional mobility training;Therapeutic activities;Therapeutic exercise    PT Next Visit Plan  continue progressive Lt ankle strengthening/ LE stretching.     PT Home Exercise Plan  Access Code: [email or web address]    Consulted and Agree with Plan of Care  Patient       Patient will benefit from skilled therapeutic intervention in order to improve the following deficits and impairments:  Postural dysfunction, Improper body mechanics, Pain, Increased fascial restricitons, Increased muscle spasms, Decreased range of motion, Decreased mobility, Decreased activity tolerance  Visit Diagnosis: Pain in left lower leg  Other symptoms and signs involving the musculoskeletal system     Problem List Patient Active Problem List   Diagnosis Date Noted  . Pain of left heel 04/03/2018  . Wears contact lenses 07/11/2017  . Mild persistent asthma without complication 032/04/3341 . Shellfish allergy 07/11/2017  . Tree nut  allergy 07/11/2017  . Chronic  pain of right knee 07/11/2017  . Elevated blood pressure reading 07/11/2017  . Poor vision 09/28/2013  . Chondromalacia of patellofemoral joint 03/23/2012  . 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 ASTHMA 03/05/2010  . DERMATITIS, PERIORAL 03/05/2010  . ACNE VULGARIS, 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   Kerin Perna, PTA 04/15/18 12:37 PM  [place], [place], [address] Phone: [phone]   Fax:  [phone]  Name: HULBERT BRANSCOME MRN: [id number] Date of Birth: 1995/12/01

## 2018-04-20 ENCOUNTER — Encounter: Payer: Self-pay | Admitting: Physical Therapy

## 2018-04-20 ENCOUNTER — Ambulatory Visit (INDEPENDENT_AMBULATORY_CARE_PROVIDER_SITE_OTHER): Payer: PRIVATE HEALTH INSURANCE | Admitting: Physical Therapy

## 2018-04-20 DIAGNOSIS — R29898 Other symptoms and signs involving the musculoskeletal system: Secondary | ICD-10-CM | POA: Diagnosis not present

## 2018-04-20 DIAGNOSIS — M79662 Pain in left lower leg: Secondary | ICD-10-CM

## 2018-04-20 NOTE — Therapy (Signed)
[employer] [facility] [place], [place], [address] Phone: [phone]   Fax:  [phone]  Physical Therapy Treatment  Patient Details  Name: Jon Salazar MRN: [id number] Date of Birth: 05/14/1995 Referring Provider (PT): Nelson Chimes PA-C    Encounter Date: 04/20/2018  PT End of Session - 04/20/18 0854    Visit Number  9    Number of Visits  12    Date for PT Re-Evaluation  05/06/18    PT Start Time  0848    PT Stop Time  0939   ice pack last 12 min    PT Time Calculation (min)  51 min       Past Medical History:  Diagnosis Date  . Allergy   . Asthma   . Lumbar stress fracture     Past Surgical History:  Procedure Laterality Date  . NO PAST SURGERIES    . UMBILICAL HERNIA REPAIR      There were no vitals filed for this visit.  Subjective Assessment - 04/20/18 0856    Subjective  Pt reports he was sore (3-4/10) in his ankle into the next day follow last session.  He continued LE over last 5 days, but didn't have a chance to play any basketball.  He states he has some mild soreness when he wakes up in the morning (around lateral malleoli) but it resolves after he gets moving.      Patient Stated Goals  return to basketball without Lt leg pain     Currently in Pain?  No/denies    Pain Score  0-No pain         [facility] PT Assessment - 04/20/18 0001      Assessment   Medical Diagnosis  Lt Achille's Tendonitis     Referring Provider (PT)  Nelson Chimes PA-C     Onset Date/Surgical Date  01/02/18    Hand Dominance  Right;Left    Next MD Visit  05/01/18    Prior Therapy  none       Flexibility   Hamstrings  hamstring stretch with opp leg flat: Rt 60 deg, Lt 60 deg         [facility] Adult PT Treatment/Exercise - 04/20/18 0001      Knee/Hip Exercises: Stretches   Passive Hamstring Stretch  Left;Right;2 reps;30 seconds   supine with strap    Gastroc Stretch  Right;Left;30 seconds;3 reps    incline board   Soleus Stretch  Right;Left;2 reps;30 seconds      Knee/Hip Exercises: Aerobic   Elliptical  L3: 3 min, 3 min backward       Knee/Hip Exercises: Machines for Technical brewer with black/blue band x 50 side to side slides.       Knee/Hip Exercises: Standing   SLS  Rt/Lt SLS on rebounder with ball toss x 30 sec x 2 reps each leg;   Mini squat with opp leg sliding out to front, side, back and curtsy x 5 reps each leg.     Other Standing Knee Exercises  bounding with single leg balance x 3 sec hold x 25 ft x 6 reps;  25 ft of simulated layup x4;  skipping wiht increased height x 4 reps of 25 ft;  fast feet leading Lt / Rt x 20 ft x 4 reps each    Other Standing Knee Exercises  side shuffle with pivot in middle x 8 reps with med-fast speed.  Cryotherapy   Number Minutes Cryotherapy  12 Minutes    Cryotherapy Location  Ankle   Lt   Type of Cryotherapy  Ice pack                  PT Long Term Goals - 04/15/18 0855      PT LONG TERM GOAL #1   Title  Decrease pain Lt Achille's and foot/ankle by 75-100% alowing patient to return to all normal functional activities 05/06/2018    Time  6    Period  Weeks    Status  Achieved      PT LONG TERM GOAL #2   Title  Increased tissue extensibility and ROM Lt LE with patient to demonstrate mobility =/> than the Rt LE 05/06/2018    Baseline  improving    Time  6    Period  Weeks    Status  On-going      PT LONG TERM GOAL #3   Title  Patient reports return to basketball for 1 to 2 hours 2-3 times/wk 05/06/2018    Time  6    Period  Weeks    Status  Partially Met      PT LONG TERM GOAL #4   Title  Independent in HEP 05/06/2018    Time  6    Period  Weeks    Status  On-going      PT LONG TERM GOAL #5   Title  Improve FOTO to </= 29% limitation 05/06/2018    Time  6    Period  Weeks    Status  On-going            Plan - 04/20/18 0925    Clinical Impression Statement  Pt able to tolerate  agility exercises without increase in Lt ankle pain. Pt's flexibility is slowly improving.  Pt plans to play basketball before next appt to assess pain level and ability.  Pt on track to meet goals.     Rehab Potential  Good    PT Frequency  2x / week    PT Duration  6 weeks    PT Treatment/Interventions  Patient/family education;ADLs/Self Care Home Management;Cryotherapy;Electrical Stimulation;Iontophoresis 48m/ml Dexamethasone;Moist Heat;Ultrasound;Dry needling;Manual techniques;Neuromuscular re-education;Functional mobility training;Therapeutic activities;Therapeutic exercise    PT Next Visit Plan  assess readiness to d/c vs hold.  FOTO    PT Home Exercise Plan  Access Code: [email or web address]    Consulted and Agree with Plan of Care  Patient       Patient will benefit from skilled therapeutic intervention in order to improve the following deficits and impairments:  Postural dysfunction, Improper body mechanics, Pain, Increased fascial restricitons, Increased muscle spasms, Decreased range of motion, Decreased mobility, Decreased activity tolerance  Visit Diagnosis: Pain in left lower leg  Other symptoms and signs involving the musculoskeletal system     Problem List Patient Active Problem List   Diagnosis Date Noted  . Pain of left heel 04/03/2018  . Wears contact lenses 07/11/2017  . Mild persistent asthma without complication 010/93/2355 . Shellfish allergy 07/11/2017  . 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/2017  . Chronic pain of right knee 07/11/2017  . Elevated blood pressure reading 07/11/2017  . Poor vision 09/28/2013  . Chondromalacia of patellofemoral joint 03/23/2012  . 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 ASTHMA 03/05/2010  . DERMATITIS, PERIORAL 03/05/2010  . ACNE VULGARIS, 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   JKerin Perna PTA 04/20/18 9:30 AM  [id number]  [place], [place], [address] Phone: [phone]   Fax:   [phone]  Name: Jon Salazar MRN: [id number] Date of Birth: [birth year]

## 2018-04-22 ENCOUNTER — Encounter: Payer: Self-pay | Admitting: Physical Therapy

## 2018-04-22 ENCOUNTER — Ambulatory Visit (INDEPENDENT_AMBULATORY_CARE_PROVIDER_SITE_OTHER): Payer: PRIVATE HEALTH INSURANCE | Admitting: Physical Therapy

## 2018-04-22 DIAGNOSIS — M79662 Pain in left lower leg: Secondary | ICD-10-CM | POA: Diagnosis not present

## 2018-04-22 DIAGNOSIS — R29898 Other symptoms and signs involving the musculoskeletal system: Secondary | ICD-10-CM | POA: Diagnosis not present

## 2018-04-22 NOTE — Therapy (Addendum)
[place], [place], [address] Phone: [phone]   Fax:  [phone]  Physical Therapy Treatment  Patient Details  Name: Jon Salazar MRN: [id number] Date of Birth: Aug 17, 1995 Referring Provider (PT): Nelson Chimes PA-C    Encounter Date: 04/22/2018  PT End of Session - 04/22/18 0852    Visit Number  10    Number of Visits  12    Date for PT Re-Evaluation  05/06/18    PT Start Time  0848    PT Stop Time  0942   ice pack last 12 min    PT Time Calculation (min)  54 min    Activity Tolerance  Patient tolerated treatment well    Behavior During Therapy  [facility] for tasks assessed/performed       Past Medical History:  Diagnosis Date  . Allergy   . Asthma   . Lumbar stress fracture     Past Surgical History:  Procedure Laterality Date  . NO PAST SURGERIES    . UMBILICAL HERNIA REPAIR      There were no vitals filed for this visit.  Subjective Assessment - 04/22/18 0857    Subjective  Pt played basketball for 2 hrs 2 days ago. He reports no difficulty with running, cutting, hopping.  He states he woke up sore the last 2 days in his whole LLE.   He states he hasn't taken Meloxicam in 4 days.  Overall he is pleased with his progress.     Patient Stated Goals  return to basketball without Lt leg pain     Currently in Pain?  Yes    Pain Score  2     Pain Location  Leg   thigh and into ankle   Pain Orientation  Left    Pain Descriptors / Indicators  Aching    Aggravating Factors   ?    Pain Relieving Factors  rest, stretching.          [facility] PT Assessment - 04/22/18 0001      Assessment   Medical Diagnosis  Lt Achille's Tendonitis     Referring Provider (PT)  Nelson Chimes PA-C     Onset Date/Surgical Date  01/02/18    Hand Dominance  Right;Left    Next MD Visit  05/01/18    Prior Therapy  none       Observation/Other Assessments   Focus on Therapeutic Outcomes (FOTO)    1% limited       [facility] Adult PT Treatment/Exercise - 04/22/18 0001      Knee/Hip Exercises: Stretches   Passive Hamstring Stretch  Left;Right;2 reps;30 seconds   supine with strap    Quad Stretch  Right;Left;4 reps;30 seconds   standing (in between exercises)   Piriformis Stretch  Left;Right;2 reps    Gastroc Stretch  Both;4 reps;30 seconds    Soleus Stretch  Both;2 reps;30 seconds      Knee/Hip Exercises: Aerobic   Elliptical  L3: 3 min, 3 min backward       Knee/Hip Exercises: Standing   Forward Step Up Limitations  forward step ups (12") x  6 steps x 6 sets, with quick step downs to return to bottom of stairs.     Lunge Walking - Round Trips  50 ft     SLS  Rt/Lt SLS on rebounder with ball toss x 30 sec x 2 reps each; repeated on gray pad x 1 rep each leg;  Mini squat with opp leg sliding out to side and curtsy x 10 reps each leg.     Other Standing Knee Exercises  skipping x 25 ft x 4 reps; skip-hop x 25 feet (simulate  lay up) x 25 ft x 2 reps    Other Standing Knee Exercises  side shuffle with pivot in middle x 8 reps with med-fast speed.       Cryotherapy   Number Minutes Cryotherapy  12 Minutes    Cryotherapy Location  Knee; , Rt knee   Type of Cryotherapy  Ice pack        PT Long Term Goals - 04/22/18 9741      PT LONG TERM GOAL #1   Title  Decrease pain Lt Achille's and foot/ankle by 75-100% alowing patient to return to all normal functional activities 05/06/2018    Time  6    Status  Achieved      PT LONG TERM GOAL #2   Title  Increased tissue extensibility and ROM Lt LE with patient to demonstrate mobility =/> than the Rt LE 05/06/2018    Baseline  improving    Time  6    Period  Weeks    Status  On-going      PT LONG TERM GOAL #3   Title  Patient reports return to basketball for 1 to 2 hours 2-3 times/wk 05/06/2018    Baseline  able to play 2 hrs without increase in pain.     Time  6    Period  Weeks    Status  Achieved      PT LONG TERM GOAL #4   Title   Independent in HEP 05/06/2018    Time  6    Period  Weeks    Status  On-going      PT LONG TERM GOAL #5   Title  Improve FOTO to </= 29% limitation 05/06/2018    Time  6    Period  Weeks    Status  Achieved           Plan - 04/22/18 0911    Clinical Impression Statement  Pt able to play basketball for 2 hrs without difficulty or pain.  Pt tolerated new agility/strengthening exercises without any production of symptoms in LLE. He did complain of sharp pain in his Rt knee with single leg mini squats; reduced with modification of exercise.  Pt has partially met his goals and verbalized readiness to hold therapy until he returns to MD.     Rehab Potential  Good    PT Frequency  2x / week    PT Duration  6 weeks    PT Treatment/Interventions  Patient/family education;ADLs/Self Care Home Management;Cryotherapy;Electrical Stimulation;Iontophoresis 30m/ml Dexamethasone;Moist Heat;Ultrasound;Dry needling;Manual techniques;Neuromuscular re-education;Functional mobility training;Therapeutic activities;Therapeutic exercise    PT Next Visit Plan  will hold therapy until MD appt, up to 1 month. If pt doesn't return by 05/20/18, will d/c.     PT Home Exercise Plan  Access Code: [email or web address]    Consulted and Agree with Plan of Care  Patient       Patient will benefit from skilled therapeutic intervention in order to improve the following deficits and impairments:  Postural dysfunction, Improper body mechanics, Pain, Increased fascial restricitons, Increased muscle spasms, Decreased range of motion, Decreased mobility, Decreased activity tolerance  Visit Diagnosis: Pain in left lower leg  Other symptoms and signs involving the musculoskeletal system     Problem List Patient Active  Problem List   Diagnosis Date Noted  . Pain of left heel 04/03/2018  . Wears contact lenses 07/11/2017  . Mild persistent asthma without complication 32/91/9166  . Shellfish allergy 07/11/2017  . Tree nut allergy  07/11/2017  . Chronic pain of right knee 07/11/2017  . Elevated blood pressure reading 07/11/2017  . Poor vision 09/28/2013  . Chondromalacia of patellofemoral joint 03/23/2012  . 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 ASTHMA 03/05/2010  . DERMATITIS, PERIORAL 03/05/2010  . ACNE VULGARIS, 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   Kerin Perna, PTA 04/22/18 10:37 AM  [place], [place], [address] Phone: [phone]   Fax:  [phone]  Name: GARVIS DOWNUM MRN: [id number] Date of Birth: [birth year]  PHYSICAL THERAPY DISCHARGE SUMMARY  Visits from Start of Care: 10  Current functional level related to goals / functional outcomes: See last progress note for discharge status    Remaining deficits: Needs to continue with HEP    Education / Equipment: HEP  Plan: Patient agrees to discharge.  Patient goals were met. Patient is being discharged due to meeting the stated rehab goals.  ?????     Celyn P. Helene Kelp PT, MPH 05/20/18 11:23 AM

## 2018-05-01 ENCOUNTER — Encounter: Payer: Self-pay | Admitting: Sports Medicine

## 2018-05-01 ENCOUNTER — Ambulatory Visit (INDEPENDENT_AMBULATORY_CARE_PROVIDER_SITE_OTHER): Payer: PRIVATE HEALTH INSURANCE | Admitting: Sports Medicine

## 2018-05-01 DIAGNOSIS — M79672 Pain in left foot: Secondary | ICD-10-CM

## 2018-05-01 NOTE — Progress Notes (Signed)
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  He has done extremely well and is near completely pain-free now with physical therapy, topical nitroglycerin, heel lifts.  He did develop some pain in his right knee, under the medial patellar facet, worse with squatting, doing lunges.  No mechanical symptoms, swelling, trauma.  He feels as though this is simply secondary to physical therapy, symptoms are mild,.  I reviewed the past medical history, family history, social history, surgical history, and allergies today and no changes were needed.  Please see the problem list section below in epic for further details.  Past Medical History: Past Medical History:  Diagnosis Date  . Allergy   . Asthma   . Lumbar stress fracture    Past Surgical History: Past Surgical History:  Procedure Laterality Date  . NO PAST SURGERIES    . UMBILICAL HERNIA REPAIR     Social History: Social History   Socioeconomic History  . Marital status: Single    Spouse name: Not on file  . Number of children: Not on file  . Years of education: Not on file  . Highest education level: Not on file  Occupational History  . Occupation: Consulting civil engineer  Social Needs  . Financial resource strain: Not on file  . Food insecurity:    Worry: Not on file    Inability: Not on file  . Transportation needs:    Medical: Not on file    Non-medical: Not on file  Tobacco Use  . Smoking status: Never Smoker  . Smokeless tobacco: Never Used  Substance and Sexual Activity  . Alcohol use: No  . Drug use: No  . Sexual activity: Never  Lifestyle  . Physical activity:    Days per week: Not on file    Minutes per session: Not on file  . Stress: Not on file  Relationships  . Social connections:    Talks on phone: Not on file    Gets  together: Not on file    Attends religious service: Not on file    Active member of club or organization: Not on file    Attends meetings of clubs or organizations: Not on file    Relationship status: Not on file  Other Topics Concern  . Not on file  Social History Narrative  . Not on file   Family History: Family History  Problem Relation Age of Onset  . Hypertension Mother    Allergies: Allergies  Allergen Reactions  . Peanut-Containing Drug Products   . Shellfish Allergy    Medications: See med rec.  Review of Systems: No fevers, chills, night sweats, weight loss, chest pain, or shortness of breath.   Objective:    General: Well Developed, well nourished, and in no acute distress.  Neuro: Alert and oriented x3, extra-ocular muscles intact, sensation grossly intact.  HEENT: Normocephalic, atraumatic, pupils equal round reactive to light, neck supple, no masses, no lymphadenopathy, thyroid nonpalpable.  Skin: Warm and dry, no rashes. Cardiac: Regular rate and rhythm, no murmurs rubs or gallops, no lower extremity edema.  Respiratory: Clear to auscultation bilaterally. Not using accessory muscles, speaking in full sentences. Left knee: Normal to inspection with no erythema or effusion or obvious bony abnormalities. Palpation normal with no warmth or joint line tenderness or patellar tenderness or condyle tenderness.  ROM normal in flexion and extension and lower leg rotation. Ligaments with solid consistent endpoints including ACL, PCL, LCL, MCL. Negative Mcmurray's and provocative meniscal tests. Non painful patellar compression. Patellar and quadriceps tendons unremarkable. Hamstring and quadriceps strength is normal.  Impression and Recommendations:    Pain of left heel Near complete improvement in insertional Achilles tendinopathy symptoms with topical nitroglycerin and physical therapy and heel lifts. He is continuing to improve so we will continue this for another  4 weeks before considering retrocalcaneal bursa injection followed by 1 week of immobilization. He did start to have some patellofemoral symptoms in his right knee due to physical therapy, but understands this will improve over time. Return to see me in 1 month. ___________________________________________ Ihor Austin. Benjamin Stain, M.D., ABFM., CAQSM. Primary Care and Sports Medicine [facility]  Adjunct Professor of Family Medicine  [employer]

## 2018-05-01 NOTE — Assessment & Plan Note (Signed)
Near complete improvement in insertional Achilles tendinopathy symptoms with topical nitroglycerin and physical therapy and heel lifts. He is continuing to improve so we will continue this for another 4 weeks before considering retrocalcaneal bursa injection followed by 1 week of immobilization. He did start to have some patellofemoral symptoms in his right knee due to physical therapy, but understands this will improve over time. Return to see me in 1 month.

## 2018-05-29 ENCOUNTER — Ambulatory Visit (INDEPENDENT_AMBULATORY_CARE_PROVIDER_SITE_OTHER): Payer: PRIVATE HEALTH INSURANCE

## 2018-05-29 ENCOUNTER — Other Ambulatory Visit: Payer: Self-pay

## 2018-05-29 ENCOUNTER — Ambulatory Visit (INDEPENDENT_AMBULATORY_CARE_PROVIDER_SITE_OTHER): Payer: PRIVATE HEALTH INSURANCE | Admitting: Sports Medicine

## 2018-05-29 ENCOUNTER — Encounter: Payer: Self-pay | Admitting: Sports Medicine

## 2018-05-29 DIAGNOSIS — M7051 Other bursitis of knee, right knee: Secondary | ICD-10-CM | POA: Diagnosis not present

## 2018-05-29 DIAGNOSIS — M25561 Pain in right knee: Secondary | ICD-10-CM | POA: Diagnosis not present

## 2018-05-29 DIAGNOSIS — M1712 Unilateral primary osteoarthritis, left knee: Secondary | ICD-10-CM

## 2018-05-29 DIAGNOSIS — M79672 Pain in left foot: Secondary | ICD-10-CM

## 2018-05-29 NOTE — Assessment & Plan Note (Signed)
For the most part he had complete improvement of insertional Achilles tendinopathy with topical nitroglycerin therapy, physical therapy, and heel lifts. His pain is now completely gone.

## 2018-05-29 NOTE — Assessment & Plan Note (Signed)
Suspect pes anserine bursitis of the right knee, however the knee is fairly swollen. I am going to start conservatively with meloxicam, rehab exercises, x-rays however tibial stress fracture is in the differential, so we are going to knock him out of basketball for now. Return to see me in a month, MRI if no better.

## 2018-05-29 NOTE — Progress Notes (Signed)
Subjective:    CC: Right knee pain  HPI: Gilmar returns, his left ankle pain is completely resolved.  Unfortunately over the past month or so he has developed pain in his right knee, initially started anteriorly but now is localized anteromedially, just distal to the joint line.  Worse with running, jumping, playing basketball, no trauma.  It is minimally swollen.  I reviewed the past medical history, family history, social history, surgical history, and allergies today and no changes were needed.  Please see the problem list section below in epic for further details.  Past Medical History: Past Medical History:  Diagnosis Date  . Allergy   . Asthma   . Lumbar stress fracture    Past Surgical History: Past Surgical History:  Procedure Laterality Date  . NO PAST SURGERIES    . UMBILICAL HERNIA REPAIR     Social History: Social History   Socioeconomic History  . Marital status: Single    Spouse name: Not on file  . Number of children: Not on file  . Years of education: Not on file  . Highest education level: Not on file  Occupational History  . Occupation: Consulting civil engineer  Social Needs  . Financial resource strain: Not on file  . Food insecurity:    Worry: Not on file    Inability: Not on file  . Transportation needs:    Medical: Not on file    Non-medical: Not on file  Tobacco Use  . Smoking status: Never Smoker  . Smokeless tobacco: Never Used  Substance and Sexual Activity  . Alcohol use: No  . Drug use: No  . Sexual activity: Never  Lifestyle  . Physical activity:    Days per week: Not on file    Minutes per session: Not on file  . Stress: Not on file  Relationships  . Social connections:    Talks on phone: Not on file    Gets together: Not on file    Attends religious service: Not on file    Active member of club or organization: Not on file    Attends meetings of clubs or organizations: Not on file    Relationship status: Not on file  Other Topics Concern   . Not on file  Social History Narrative  . Not on file   Family History: Family History  Problem Relation Age of Onset  . Hypertension Mother    Allergies: Allergies  Allergen Reactions  . Peanut-Containing Drug Products   . Shellfish Allergy    Medications: See med rec.  Review of Systems: No fevers, chills, night sweats, weight loss, chest pain, or shortness of breath.   Objective:    General: Well Developed, well nourished, and in no acute distress.  Neuro: Alert and oriented x3, extra-ocular muscles intact, sensation grossly intact.  HEENT: Normocephalic, atraumatic, pupils equal round reactive to light, neck supple, no masses, no lymphadenopathy, thyroid nonpalpable.  Skin: Warm and dry, no rashes. Cardiac: Regular rate and rhythm, no murmurs rubs or gallops, no lower extremity edema.  Respiratory: Clear to auscultation bilaterally. Not using accessory muscles, speaking in full sentences. Right knee: Ever so slightly swollen and full, there is tenderness at the insertion of the sartorius, gracilis, and semitendinosus at the pes anserine bursa. ROM normal in flexion and extension and lower leg rotation. Ligaments with solid consistent endpoints including ACL, PCL, LCL, MCL. Negative Mcmurray's and provocative meniscal tests. Non painful patellar compression. Patellar and quadriceps tendons unremarkable. Hamstring and quadriceps strength is  normal.  Impression and Recommendations:    Pes anserinus bursitis of right knee Suspect pes anserine bursitis of the right knee, however the knee is fairly swollen. I am going to start conservatively with meloxicam, rehab exercises, x-rays however tibial stress fracture is in the differential, so we are going to knock him out of basketball for now. Return to see me in a month, MRI if no better.   Pain of left heel For the most part he had complete improvement of insertional Achilles tendinopathy with topical nitroglycerin therapy,  physical therapy, and heel lifts. His pain is now completely gone.    ___________________________________________ Ihor Austin. Benjamin Stain, M.D., ABFM., CAQSM. Primary Care and Sports Medicine [facility]  Adjunct Professor of Family Medicine  [employer]

## 2018-06-26 ENCOUNTER — Ambulatory Visit (INDEPENDENT_AMBULATORY_CARE_PROVIDER_SITE_OTHER): Payer: PRIVATE HEALTH INSURANCE | Admitting: Sports Medicine

## 2018-06-26 ENCOUNTER — Encounter: Payer: Self-pay | Admitting: Sports Medicine

## 2018-06-26 DIAGNOSIS — M25561 Pain in right knee: Secondary | ICD-10-CM | POA: Diagnosis not present

## 2018-06-26 DIAGNOSIS — G8929 Other chronic pain: Secondary | ICD-10-CM

## 2018-06-26 NOTE — Assessment & Plan Note (Signed)
Persistent anterior pain, patellofemoral grinding. Adding formal physical therapy, and due to failure of greater than 6 weeks of physician directed conservative measures, unremarkable x-rays we are going to proceed with an MRI. He will also wear his reaction knee brace. Return to see me after the MRI to go over results, we may inject the knee.

## 2018-06-26 NOTE — Progress Notes (Signed)
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  We treated him conservatively with activity modification, NSAIDs, rehab exercises.  6 weeks is now passed and he has persistent pain, no improvement.  Localized anteriorly without radiation.  No overt mechanical symptoms.  I reviewed the past medical history, family history, social history, surgical history, and allergies today and no changes were needed.  Please see the problem list section below in epic for further details.  Past Medical History: Past Medical History:  Diagnosis Date  . Allergy   . Asthma   . Lumbar stress fracture    Past Surgical History: Past Surgical History:  Procedure Laterality Date  . NO PAST SURGERIES    . UMBILICAL HERNIA REPAIR     Social History: Social History   Socioeconomic History  . Marital status: Single    Spouse name: Not on file  . Number of children: Not on file  . Years of education: Not on file  . Highest education level: Not on file  Occupational History  . Occupation: Consulting civil engineertudent  Social Needs  . Financial resource strain: Not on file  . Food insecurity:    Worry: Not on file    Inability: Not on file  . Transportation needs:    Medical: Not on file    Non-medical: Not on file  Tobacco Use  . Smoking status: Never Smoker  . Smokeless tobacco: Never Used  Substance and Sexual Activity  . Alcohol use: No  . Drug use: No  . Sexual activity: Never  Lifestyle  . Physical activity:    Days per week: Not on file    Minutes per session: Not on file  . Stress: Not on file  Relationships  . Social connections:    Talks on phone: Not on file    Gets together: Not on file    Attends religious service: Not on file    Active member of club or organization: Not on file    Attends meetings of clubs or organizations: Not on file     Relationship status: Not on file  Other Topics Concern  . Not on file  Social History Narrative  . Not on file   Family History: Family History  Problem Relation Age of Onset  . Hypertension Mother    Allergies: Allergies  Allergen Reactions  . Peanut-Containing Drug Products   . Shellfish Allergy    Medications: See med rec.  Review of Systems: No fevers, chills, night sweats, weight loss, chest pain, or shortness of breath.   Objective:    General: Well Developed, well nourished, and in no acute distress.  Neuro: Alert and oriented x3, extra-ocular muscles intact, sensation grossly intact.  HEENT: Normocephalic, atraumatic, pupils equal round reactive to light, neck supple, no masses, no lymphadenopathy, thyroid nonpalpable.  Skin: Warm and dry, no rashes. Cardiac: Regular rate and rhythm, no murmurs rubs or gallops, no lower extremity edema.  Respiratory: Clear to auscultation bilaterally. Not using accessory muscles, speaking in full sentences. Right knee: Normal to inspection with no erythema or effusion or obvious bony abnormalities. Palpation normal with no warmth or joint line tenderness or patellar tenderness or condyle tenderness. ROM normal in flexion and extension and lower leg rotation. Ligaments with solid consistent endpoints including ACL, PCL, LCL, MCL. Negative Mcmurray's and provocative meniscal tests. Painful patellar compression with  crepitus. Patellar and quadriceps tendons unremarkable. Hamstring and quadriceps strength is normal.  Impression and Recommendations:    Chronic pain of right knee Persistent anterior pain, patellofemoral grinding. Adding formal physical therapy, and due to failure of greater than 6 weeks of physician directed conservative measures, unremarkable x-rays we are going to proceed with an MRI. He will also wear his reaction knee brace. Return to see me after the MRI to go over results, we may inject the knee.    ___________________________________________ Ihor Austin. Benjamin Stain, M.D., ABFM., CAQSM. Primary Care and Sports Medicine [facility]  Adjunct Professor of Family Medicine  [employer]

## 2018-06-29 ENCOUNTER — Ambulatory Visit (INDEPENDENT_AMBULATORY_CARE_PROVIDER_SITE_OTHER): Payer: PRIVATE HEALTH INSURANCE

## 2018-06-29 ENCOUNTER — Other Ambulatory Visit: Payer: Self-pay

## 2018-06-29 DIAGNOSIS — G8929 Other chronic pain: Secondary | ICD-10-CM

## 2018-06-29 DIAGNOSIS — M25561 Pain in right knee: Secondary | ICD-10-CM | POA: Diagnosis not present

## 2018-07-01 ENCOUNTER — Ambulatory Visit (INDEPENDENT_AMBULATORY_CARE_PROVIDER_SITE_OTHER): Payer: PRIVATE HEALTH INSURANCE | Admitting: Sports Medicine

## 2018-07-01 ENCOUNTER — Encounter: Payer: Self-pay | Admitting: Sports Medicine

## 2018-07-01 DIAGNOSIS — M2241 Chondromalacia patellae, right knee: Secondary | ICD-10-CM

## 2018-07-01 NOTE — Assessment & Plan Note (Signed)
Chondral fissuring, subchondral edema in the trochlear groove and the posterior medial tibial plateau. Failure of conservative treatment we did an injection today, nonweightbearing with crutches for 2 weeks. Return to see me in 2 weeks, if all of this fails he will likely need arthroscopy with microfracture.

## 2018-07-01 NOTE — Progress Notes (Signed)
Subjective:    CC: Right knee pain  HPI: Hervin returns, he is a pleasant 23 year old male with right knee pain chronically, MRI ended up showing chondral fissuring in the trochlear groove, patella, as well as subchondral edema in the tibial plateau.  He failed conservative measures, pain is severe, persistent, localized without radiation.  I reviewed the past medical history, family history, social history, surgical history, and allergies today and no changes were needed.  Please see the problem list section below in epic for further details.  Past Medical History: Past Medical History:  Diagnosis Date  . Allergy   . Asthma   . Lumbar stress fracture    Past Surgical History: Past Surgical History:  Procedure Laterality Date  . NO PAST SURGERIES    . UMBILICAL HERNIA REPAIR     Social History: Social History   Socioeconomic History  . Marital status: Single    Spouse name: Not on file  . Number of children: Not on file  . Years of education: Not on file  . Highest education level: Not on file  Occupational History  . Occupation: Consulting civil engineer  Social Needs  . Financial resource strain: Not on file  . Food insecurity:    Worry: Not on file    Inability: Not on file  . Transportation needs:    Medical: Not on file    Non-medical: Not on file  Tobacco Use  . Smoking status: Never Smoker  . Smokeless tobacco: Never Used  Substance and Sexual Activity  . Alcohol use: No  . Drug use: No  . Sexual activity: Never  Lifestyle  . Physical activity:    Days per week: Not on file    Minutes per session: Not on file  . Stress: Not on file  Relationships  . Social connections:    Talks on phone: Not on file    Gets together: Not on file    Attends religious service: Not on file    Active member of club or organization: Not on file    Attends meetings of clubs or organizations: Not on file    Relationship status: Not on file  Other Topics Concern  . Not on file  Social  History Narrative  . Not on file   Family History: Family History  Problem Relation Age of Onset  . Hypertension Mother    Allergies: Allergies  Allergen Reactions  . Peanut-Containing Drug Products   . Shellfish Allergy    Medications: See med rec.  Review of Systems: No fevers, chills, night sweats, weight loss, chest pain, or shortness of breath.   Objective:    General: Well Developed, well nourished, and in no acute distress.  Neuro: Alert and oriented x3, extra-ocular muscles intact, sensation grossly intact.  HEENT: Normocephalic, atraumatic, pupils equal round reactive to light, neck supple, no masses, no lymphadenopathy, thyroid nonpalpable.  Skin: Warm and dry, no rashes. Cardiac: Regular rate and rhythm, no murmurs rubs or gallops, no lower extremity edema.  Respiratory: Clear to auscultation bilaterally. Not using accessory muscles, speaking in full sentences.  Procedure: Real-time Ultrasound Guided injection of the right knee Device: GE Logiq E  Verbal informed consent obtained.  Time-out conducted.  Noted no overlying erythema, induration, or other signs of local infection.  Skin prepped in a sterile fashion.  Local anesthesia: Topical Ethyl chloride.  With sterile technique and under real time ultrasound guidance:  Noted mild effusion, 1 cc Kenalog 40, 2 cc lidocaine, 2 cc bupivacaine injected easily  Completed without difficulty  Pain immediately resolved suggesting accurate placement of the medication.  Advised to call if fevers/chills, erythema, induration, drainage, or persistent bleeding.  Images permanently stored and available for review in the ultrasound unit.  Impression: Technically successful ultrasound guided injection.  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 Failure of conservative treatment we did an  injection today, nonweightbearing with crutches for 2 weeks. Return to see me in 2 weeks, if all of this fails he will likely need arthroscopy with microfracture.   ___________________________________________ Ihor Austinhomas J. Benjamin Stainhekkekandam, M.D., ABFM., CAQSM. Primary Care and Sports Medicine [facility]  Adjunct Professor of Family Medicine  [employer]

## 2018-07-03 ENCOUNTER — Ambulatory Visit (INDEPENDENT_AMBULATORY_CARE_PROVIDER_SITE_OTHER): Payer: PRIVATE HEALTH INSURANCE | Admitting: Rehabilitative and Restorative Service Providers"

## 2018-07-03 ENCOUNTER — Other Ambulatory Visit: Payer: Self-pay

## 2018-07-03 ENCOUNTER — Encounter: Payer: Self-pay | Admitting: Rehabilitative and Restorative Service Providers"

## 2018-07-03 DIAGNOSIS — M25561 Pain in right knee: Secondary | ICD-10-CM | POA: Diagnosis not present

## 2018-07-03 DIAGNOSIS — R29898 Other symptoms and signs involving the musculoskeletal system: Secondary | ICD-10-CM | POA: Diagnosis not present

## 2018-07-03 NOTE — Patient Instructions (Signed)
Access Code: [facility]  URL: [email or web address]  Date: 07/03/2018  Prepared by: Corlis Leak   Exercises  Supine Quad Set - 10 reps - 1-2 sets - 5 sec hold - 2x daily - 7x weekly  Straight Leg Raise with External Rotation - 10 reps - 1-2 sets - 5 sec hold - 2x daily - 7x weekly  Supine Hip Adduction Isometric with Ball - 10 reps - 1-2 sets - 5 sec hold - 2x daily - 7x weekly   Gait instruction NWB with axillary crutches

## 2018-07-03 NOTE — Therapy (Signed)
[facility] [place] [address] [place], [place], [address] Phone: [phone]   Fax:  [phone]  Physical Therapy Evaluation  Patient Details  Name: Jon Salazar MRN: [id number] Date of Birth: 12-18-1995 Referring Provider (PT): Dr Benjamin Stain   Encounter Date: 07/03/2018  PT End of Session - 07/03/18 1204    Visit Number  1    Number of Visits  12    Date for PT Re-Evaluation  08/14/18    PT Start Time  1000    PT Stop Time  1054    PT Time Calculation (min)  54 min    Activity Tolerance  Patient tolerated treatment well       Past Medical History:  Diagnosis Date  . Allergy   . Asthma   . Lumbar stress fracture     Past Surgical History:  Procedure Laterality Date  . NO PAST SURGERIES    . UMBILICAL HERNIA REPAIR      There were no vitals filed for this visit.   Subjective Assessment - 07/03/18 1003    Subjective  Rt knee pain for the past 1-2 months with no known injury.     Pertinent History  History of Rt knee pain diagnosed as tendinitis; Lt aknle severe sprain 11/19 and 6/19 with history ot Lt ankle sprain 3-4 times in the past 5 yrs; LBP ~ fx of lumbar spine 2015      Diagnostic tests  xrays; MRI; [place]  - MRI - Chondral fissuring, subchondral edema in the trochlear groove and the posterior medial tibial plateau.    Patient Stated Goals  return to sports and normal life     Currently in Pain?  No/denies    Pain Score  3     Pain Location  Knee    Pain Orientation  Right    Pain Descriptors / Indicators  Dull    Pain Type  Chronic pain    Pain Onset  More than a month ago    Pain Frequency  Intermittent    Aggravating Factors   stairs; twisting and turning; running; jumping     Pain Relieving Factors  injection; ice some help          [facility] PT Assessment - 07/03/18 0001      Assessment   Medical Diagnosis  Chronic pain Rt knee    Referring Provider (PT)  Dr Benjamin Stain    Onset  Date/Surgical Date  05/17/18   intermittent pain over the past 5 yrs    Hand Dominance  Right;Left    Next MD Visit  07/15/2018    Prior Therapy  here for Lt ankle       Precautions   Precautions  None      Restrictions   Weight Bearing Restrictions  No    Other Position/Activity Restrictions  limit wt bearing until return to MD in 2 weeks       Balance Screen   Has the patient fallen in the past 6 months  No    Has the patient had a decrease in activity level because of a fear of falling?   No    Is the patient reluctant to leave their home because of a fear of falling?   No      Prior Function   Level of Independence  Independent    Vocation  Student    Vocation Requirements  summer classes on line start in ~ 2 weeks  Leisure  basketball; video games       Observation/Other Assessments   Focus on Therapeutic Outcomes (FOTO)   54% limitation       Sensation   Additional Comments  WFL's       Posture/Postural Control   Posture Comments  head forward; shoulders rounded; wt shifted to Lt in standing       AROM   Right/Left Hip  --   tght end ranges hip rotation bilat    Right Knee Extension  0    Right Knee Flexion  132    Left Knee Extension  0    Left Knee Flexion  138    Right/Left Ankle  --   WFL's bilat      Strength   Right/Left Hip  --   Lt hip 5/5 throughout    Right Hip Flexion  --   5-/5   Right Hip Extension  5/5    Right Hip ABduction  --   5-/5   Right Hip ADduction  4+/5      Flexibility   Hamstrings  tight bilat Rt slightly more than Lt     Quadriceps  Rt 120 deg knee flexion in prone; Lt 124 degrees     ITB  tight Rt > Lt     Piriformis  tight Rt > Lt       Palpation   Patella mobility  tightness with medial/lateral glide Rt > Lt     Palpation comment  tender to palpation medial joint line and medial distal knee       Ambulation/Gait   Gait Comments  ambulates with [place] knee support without assistive device ambulates with limp on Rt LE  with wt bearing on Rt  - instructed in ambulatioin with crutches NWB per MD note 07/01/2018                 Objective measurements completed on examination: See above findings.      [facility] Adult PT Treatment/Exercise - 07/03/18 0001      Ambulation/Gait   Ambulation/Gait  --   NWB Rt LE per MD note    Ambulation Distance (Feet)  30 Feet    Assistive device  Crutches    Gait Pattern  Step-through pattern    Ambulation Surface  Level    Gait velocity  slowed     Stairs  Yes    Stairs Assistance  5: Supervision    Stairs Assistance Details (indicate cue type and reason)  up and down 2 6 inch steps/3 4 in steps with crutches NWB       Knee/Hip Exercises: Supine   Quad Sets  AROM;Strengthening;Right;10 reps   5 sec hold small noodle under knee to avoid hyperextension    Hip Adduction Isometric  Strengthening;Both;10 reps   3-5sec hold ball squeeze    Straight Leg Raise with External Rotation  AROM;Strengthening;Right;10 reps   3 sec hold             PT Education - 07/03/18 1059    Education Details  HEP; gait with axillary crutches     Person(s) Educated  Patient    Methods  Explanation;Demonstration;Tactile cues;Verbal cues;Handout    Comprehension  Verbalized understanding;Returned demonstration;Verbal cues required;Tactile cues required          PT Long Term Goals - 07/03/18 1213      PT LONG TERM GOAL #1   Title  Decrease pain Rt knee to allow patient to return to  all normal functional activities 08/14/2018    Baseline  -    Time  6    Period  Weeks    Status  New      PT LONG TERM GOAL #2   Title  Increased tissue extensibility and ROM Rt LE with patient to demonstrate mobility =/> than the Lt LE 08/14/2018    Baseline  -    Time  6    Period  Weeks    Status  New      PT LONG TERM GOAL #3   Title  Patient demonstrated normal gait pattern withou tassistive device 08/14/2018    Baseline  -    Time  6    Period  Weeks    Status  New      PT  LONG TERM GOAL #4   Title  Independent in HEP 08/14/2018    Time  6    Period  Weeks    Status  New      PT LONG TERM GOAL #5   Title  Improve FOTO to </= 32% limitation 08/14/2018    Time  6    Period  Weeks    Status  New             Plan - 07/03/18 1048    Clinical Impression Statement  Patient presents with 1-2 month history of Rt knee pain. He has no known injury but history of intermittent knee pain and Lt ankle pain over the past 4-5 years. He received injection Rt knee 07/01/2018 with good decrease in knee pain. Patient is NWB until return to MD 07/15/2018. Patient has some decrease in Rt knee ROM; decreased LE strength; tenderness to palpation medial knee. Patient will benefit from PT to address deficits identified.     Stability/Clinical Decision Making  Stable/Uncomplicated    Clinical Decision Making  Low    Rehab Potential  Good    PT Frequency  2x / week    PT Duration  6 weeks    PT Treatment/Interventions  Patient/family education;ADLs/Self Care Home Management;Cryotherapy;Electrical Stimulation;Iontophoresis 4mg /ml Dexamethasone;Moist Heat;Ultrasound;Dry needling;Therapeutic activities;Therapeutic exercise;Neuromuscular re-education;Gait training    PT Next Visit Plan  review HEP     PT Home Exercise Plan  Access Code: [email or web address]     Consulted and Agree with Plan of Care  Patient       Patient will benefit from skilled therapeutic intervention in order to improve the following deficits and impairments:  Postural dysfunction, Improper body mechanics, Pain, Increased fascial restricitons, Increased muscle spasms, Decreased mobility, Decreased range of motion, Decreased endurance, Decreased activity tolerance, Abnormal gait, Decreased strength  Visit Diagnosis: Acute pain of right knee - Plan: PT plan of care cert/re-cert  Other symptoms and signs involving the musculoskeletal system - Plan: PT plan of care cert/re-cert     Problem List Patient Active Problem List    Diagnosis Date Noted  . Pes anserinus bursitis of right knee 05/29/2018  . Pain of left heel 04/03/2018  . Wears contact lenses 07/11/2017  . Mild persistent asthma without complication 07/11/2017  . Shellfish allergy 07/11/2017  . 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/2017  . 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  . Elevated blood pressure reading 07/11/2017  . Poor vision 09/28/2013  . 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 ASTHMA 03/05/2010  . DERMATITIS, PERIORAL 03/05/2010  . ACNE VULGARIS, 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    Maryna Yeagle Rober Minion PT, MPH  07/03/2018, 12:27 PM  [facility] [address] [place], [place], [address] Phone: [phone]   Fax:  [phone]  Name: Jon Salazar MRN: [id number] Date of Birth: 07/16/1995

## 2018-07-10 ENCOUNTER — Other Ambulatory Visit: Payer: Self-pay

## 2018-07-10 ENCOUNTER — Encounter: Payer: Self-pay | Admitting: Rehabilitative and Restorative Service Providers"

## 2018-07-10 ENCOUNTER — Ambulatory Visit (INDEPENDENT_AMBULATORY_CARE_PROVIDER_SITE_OTHER): Payer: PRIVATE HEALTH INSURANCE | Admitting: Rehabilitative and Restorative Service Providers"

## 2018-07-10 DIAGNOSIS — R29898 Other symptoms and signs involving the musculoskeletal system: Secondary | ICD-10-CM

## 2018-07-10 DIAGNOSIS — M79662 Pain in left lower leg: Secondary | ICD-10-CM

## 2018-07-10 DIAGNOSIS — M25561 Pain in right knee: Secondary | ICD-10-CM

## 2018-07-10 NOTE — Therapy (Signed)
[facility] [address] [place] [place], [place], [address] Phone: [phone]   Fax:  [phone]  Physical Therapy Treatment  Patient Details  Name: Jon Salazar MRN: [id number] Date of Birth: 1995/11/19 Referring Provider (PT): Dr Benjamin Stain   Encounter Date: 07/10/2018  PT End of Session - 07/10/18 1000    Visit Number  2    Number of Visits  12    Date for PT Re-Evaluation  08/14/18    PT Start Time  1000    PT Stop Time  1058    PT Time Calculation (min)  58 min    Activity Tolerance  Patient tolerated treatment well       Past Medical History:  Diagnosis Date  . Allergy   . Asthma   . Lumbar stress fracture     Past Surgical History:  Procedure Laterality Date  . NO PAST SURGERIES    . UMBILICAL HERNIA REPAIR      There were no vitals filed for this visit.  Subjective Assessment - 07/10/18 1003    Subjective  Rt knee pain "comes and goes" - not sure what makes it hurt. He has a "dull, aching pain" maybe 2-3/10 at times lasting 10-15 min and then resolved    Currently in Pain?  No/denies                       [facility] Adult PT Treatment/Exercise - 07/10/18 0001      Knee/Hip Exercises: Stretches   Passive Hamstring Stretch  Right;2 reps;30 seconds   supine with strap    Passive Hamstring Stretch Limitations  sitting - Rt LE extended 30 sec x 2 reps     ITB Stretch  Right;2 reps;30 seconds   supine with strap    Piriformis Stretch  Right;2 reps;30 seconds   supine travell      Knee/Hip Exercises: Supine   Quad Sets  AROM;Strengthening;Right;10 reps   5 sec hold small noodle under knee to avoid hyperextension    Hip Adduction Isometric  Strengthening;Both;10 reps   3-5sec hold ball squeeze    Straight Leg Raise with External Rotation  AROM;Strengthening;Right;10 reps   3 sec hold    Other Supine Knee/Hip Exercises  clam - blue TB x 10 each side       Knee/Hip Exercises:  Sidelying   Clams  clam - blue TB x 10 reps     Other Sidelying Knee/Hip Exercises  foot taps fwd/back x 10 each LE (fatigues quickly)      Knee/Hip Exercises: Prone   Hip Extension  Strengthening;Right;Left;10 reps    Other Prone Exercises  glut sets 10 sec x 10       Vasopneumatic   Number Minutes Vasopneumatic   15 minutes    Vasopnuematic Location   Knee   Rt    Vasopneumatic Pressure  Low    Vasopneumatic Temperature   34 deg             PT Education - 07/10/18 1051    Education Details  HEP     Person(s) Educated  Patient    Methods  Explanation;Demonstration;Tactile cues;Verbal cues;Handout    Comprehension  Verbalized understanding;Returned demonstration;Verbal cues required;Tactile cues required          PT Long Term Goals - 07/03/18 1213      PT LONG TERM GOAL #1   Title  Decrease pain Rt knee to allow patient to return  to all normal functional activities 08/14/2018    Baseline  -    Time  6    Period  Weeks    Status  New      PT LONG TERM GOAL #2   Title  Increased tissue extensibility and ROM Rt LE with patient to demonstrate mobility =/> than the Lt LE 08/14/2018    Baseline  -    Time  6    Period  Weeks    Status  New      PT LONG TERM GOAL #3   Title  Patient demonstrated normal gait pattern withou tassistive device 08/14/2018    Baseline  -    Time  6    Period  Weeks    Status  New      PT LONG TERM GOAL #4   Title  Independent in HEP 08/14/2018    Time  6    Period  Weeks    Status  New      PT LONG TERM GOAL #5   Title  Improve FOTO to </= 32% limitation 08/14/2018    Time  6    Period  Weeks    Status  New            Plan - 07/10/18 1001    Clinical Impression Statement  Patient reports some intermittent aching in the patellar area. Modified exercise to have support with quad set and SLR. Added exercise for strengthening hips - NWB. Tolerated well - note fatigue with exercises.     Stability/Clinical Decision Making   Stable/Uncomplicated    Rehab Potential  Good    PT Frequency  2x / week    PT Duration  6 weeks    PT Treatment/Interventions  Patient/family education;ADLs/Self Care Home Management;Cryotherapy;Electrical Stimulation;Iontophoresis 4mg /ml Dexamethasone;Moist Heat;Ultrasound;Dry needling;Therapeutic activities;Therapeutic exercise;Neuromuscular re-education;Gait training    PT Next Visit Plan  review HEP; progress with strengthening as tolerated - progress with Wt bearing per MD order.     PT Home Exercise Plan  Access Code: [email or web address]     Consulted and Agree with Plan of Care  Patient       Patient will benefit from skilled therapeutic intervention in order to improve the following deficits and impairments:  Postural dysfunction, Improper body mechanics, Pain, Increased fascial restricitons, Increased muscle spasms, Decreased mobility, Decreased range of motion, Decreased endurance, Decreased activity tolerance, Abnormal gait, Decreased strength  Visit Diagnosis: Acute pain of right knee  Other symptoms and signs involving the musculoskeletal system  Pain in left lower leg     Problem List Patient Active Problem List   Diagnosis Date Noted  . Pes anserinus bursitis of right knee 05/29/2018  . Pain of left heel 04/03/2018  . Wears contact lenses 07/11/2017  . Mild persistent asthma without complication 07/11/2017  . Shellfish allergy 07/11/2017  . 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/2017  . 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  . Elevated blood pressure reading 07/11/2017  . Poor vision 09/28/2013  . 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 ASTHMA 03/05/2010  . DERMATITIS, PERIORAL 03/05/2010  . ACNE VULGARIS, 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    Nimrit Kehres Rober MinionP Antoria Lanza PT, MPH  07/10/2018, 10:55 AM  [facility] [address] [address] [place], [place], [address] Phone: [phone]   Fax:  [phone]  Name: Jon Salazar MRN:  [id number] Date of Birth: 10/23/1995

## 2018-07-10 NOTE — Patient Instructions (Signed)
Access Code: [facility]  URL: [email or web address]  Date: 07/10/2018  Prepared by: Corlis Leak   Exercises  Supine Quad Set - 10 reps - 1-2 sets - 5 sec hold - 2x daily - 7x weekly  Straight Leg Raise with External Rotation - 10 reps - 1-2 sets - 5 sec hold - 2x daily - 7x weekly  Supine Hip Adduction Isometric with Ball - 10 reps - 1-2 sets - 5 sec hold - 2x daily - 7x weekly  Today  Clamshell with Resistance - 10 reps - 1-2 sets - 5 sec hold - 2x daily - 7x weekly  Sidelying Hip Abduction - 10 reps - 1-2 sets - 30 sec hold - 2x daily - 7x weekly  Prone Hip Extension Sequence - 10 reps - 1-2 sets - 5-10sec hold - 2x daily - 7x weekly  Prone Hip Extension - One Pillow - 10 reps - 1-2 sets - 5 sec hold - 2x daily - 7x weekly  Sidelying Diagonal Hip Abduction - 10 reps - 1-2 sets - 30 sec hold - 2x daily - 7x weekly  Prone Gluteal Sets - 10 reps - 1-2 sets - 30 sec hold - 2x daily - 7x weekly  Sidelying Diagonal Hip Abduction - 10 reps - 1-2 sets - 5 sec hold - 2x daily - 7x weekly  Bent Knee Fallouts - 10 reps - 1-2 sets - 5sec hold - 2x daily - 7x weekly

## 2018-07-15 ENCOUNTER — Encounter: Payer: PRIVATE HEALTH INSURANCE | Admitting: Physician Assistant

## 2018-07-15 ENCOUNTER — Encounter: Payer: Self-pay | Admitting: Sports Medicine

## 2018-07-15 ENCOUNTER — Ambulatory Visit (INDEPENDENT_AMBULATORY_CARE_PROVIDER_SITE_OTHER): Payer: PRIVATE HEALTH INSURANCE | Admitting: Sports Medicine

## 2018-07-15 ENCOUNTER — Ambulatory Visit (INDEPENDENT_AMBULATORY_CARE_PROVIDER_SITE_OTHER): Payer: PRIVATE HEALTH INSURANCE | Admitting: Physical Therapy

## 2018-07-15 ENCOUNTER — Other Ambulatory Visit: Payer: Self-pay

## 2018-07-15 DIAGNOSIS — M25561 Pain in right knee: Secondary | ICD-10-CM

## 2018-07-15 DIAGNOSIS — R29898 Other symptoms and signs involving the musculoskeletal system: Secondary | ICD-10-CM | POA: Diagnosis not present

## 2018-07-15 DIAGNOSIS — M2241 Chondromalacia patellae, right knee: Secondary | ICD-10-CM

## 2018-07-15 NOTE — Progress Notes (Signed)
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  I reviewed the past medical history, family history, social history, surgical history, and allergies today and no changes were needed.  Please see the problem list section below in epic for further details.  Past Medical History: Past Medical History:  Diagnosis Date  . Allergy   . Asthma   . Lumbar stress fracture    Past Surgical History: Past Surgical History:  Procedure Laterality Date  . NO PAST SURGERIES    . UMBILICAL HERNIA REPAIR     Social History: Social History   Socioeconomic History  . Marital status: Single    Spouse name: Not on file  . Number of children: Not on file  . Years of education: Not on file  . Highest education level: Not on file  Occupational History  . Occupation: Consulting civil engineertudent  Social Needs  . Financial resource strain: Not on file  . Food insecurity:    Worry: Not on file    Inability: Not on file  . Transportation needs:    Medical: Not on file    Non-medical: Not on file  Tobacco Use  . Smoking status: Never Smoker  . Smokeless tobacco: Never Used  Substance and Sexual Activity  . Alcohol use: No  . Drug use: No  . Sexual activity: Never  Lifestyle  . Physical activity:    Days per week: Not on file    Minutes per session: Not on file  . Stress: Not on file  Relationships  . Social connections:    Talks on phone: Not on file    Gets together: Not on file    Attends religious service: Not on file    Active member of club or organization: Not on file    Attends meetings of clubs or organizations: Not on file    Relationship status: Not on file  Other Topics Concern  . Not on file  Social History Narrative  . Not  on file   Family History: Family History  Problem Relation Age of Onset  . Hypertension Mother    Allergies: Allergies  Allergen Reactions  . Peanut-Containing Drug Products   . Shellfish Allergy    Medications: See med rec.  Review of Systems: No fevers, chills, night sweats, weight loss, chest pain, or shortness of breath.   Objective:    General: Well Developed, well nourished, and in no acute distress.  Neuro: Alert and oriented x3, extra-ocular muscles intact, sensation grossly intact.  HEENT: Normocephalic, atraumatic, pupils equal round reactive to light, neck supple, no masses, no lymphadenopathy, thyroid nonpalpable.  Skin: Warm and dry, no rashes. Cardiac: Regular rate and rhythm, no murmurs rubs or gallops, no lower extremity edema.  Respiratory: Clear to auscultation bilaterally. Not using accessory muscles, speaking in full sentences. Right knee: Normal to inspection with no erythema or effusion or obvious bony abnormalities. Palpation normal with no warmth or joint line tenderness or patellar tenderness or condyle tenderness. ROM normal in flexion and extension and lower leg rotation. Ligaments with solid consistent endpoints including ACL, PCL, LCL, MCL. Negative Mcmurray's and provocative meniscal tests. Non painful patellar compression. Patellar and quadriceps tendons unremarkable. Hamstring and quadriceps strength is normal.  Impression and Recommendations:    Chondromalacia  of right patellofemoral joint MRI did show chondral fissuring as well as subchondral edema in the trochlear groove and medial tibial plateau. We did an injection at the last visit. He continues with physical therapy, he is about 70% better and continuing to improve. Do an additional month of physical therapy, come back to see me and we will discuss surgical referral if still needed at that time.   ___________________________________________ Ihor Austin. Benjamin Stain, M.D., ABFM., CAQSM.  Primary Care and Sports Medicine [facility]  Adjunct Professor of Family Medicine  [employer]

## 2018-07-15 NOTE — Assessment & Plan Note (Signed)
MRI did show chondral fissuring as well as subchondral edema in the trochlear groove and medial tibial plateau. We did an injection at the last visit. He continues with physical therapy, he is about 70% better and continuing to improve. Do an additional month of physical therapy, come back to see me and we will discuss surgical referral if still needed at that time.

## 2018-07-15 NOTE — Therapy (Signed)
[facility] [address] [place], [place], [address] Phone: [phone]   Fax:  [phone]  Physical Therapy Treatment  Patient Details  Name: Jon Salazar MRN: [id number] Date of Birth: 05/11/1995 Referring Provider (PT): Dr Benjamin Stainhekkekandam   Encounter Date: 07/15/2018  PT End of Session - 07/15/18 1223    Visit Number  3    Number of Visits  12    Date for PT Re-Evaluation  08/14/18    PT Start Time  1132    PT Stop Time  1225    PT Time Calculation (min)  53 min    Activity Tolerance  Patient tolerated treatment well    Behavior During Therapy  [facility] for tasks assessed/performed       Past Medical History:  Diagnosis Date  . Allergy   . Asthma   . Lumbar stress fracture     Past Surgical History:  Procedure Laterality Date  . NO PAST SURGERIES    . UMBILICAL HERNIA REPAIR      There were no vitals filed for this visit.  Subjective Assessment - 07/15/18 1223    Subjective  Pt reports he visited MD today; WB restrictions have been lifted, "he told me to start walking, just no sports yet".  Pt reports he has minor pain with TKE when he doesn't have something behind knee.      Diagnostic tests  xrays; MRI; US  - MRI - Chondral fissuring, subchondral edema in the trochlear groove and the posterior medial tibial plateau.    Patient Stated Goals  return to sports and normal life     Currently in Pain?  No/denies    Pain Score  0-No pain         [facility] PT Assessment - 07/15/18 0001      Assessment   Medical Diagnosis  Chronic pain Rt knee    Referring Provider (PT)  Dr Benjamin Stainhekkekandam    Onset Date/Surgical Date  05/17/18   intermittent pain over the past 5 yrs    Hand Dominance  Right;Left    Next MD Visit  07/15/2018    Prior Therapy  here for Lt ankle       Precautions   Precautions  None    Precaution Comments  NWB restrictions lifted.       Strength   Right Hip ABduction  --   5-/5   Right  Hip ADduction  4+/5      Flexibility   Quadriceps  Rt 140 deg          [facility] Adult PT Treatment/Exercise - 07/15/18 0001      Knee/Hip Exercises: Stretches   Passive Hamstring Stretch  Right;2 reps;30 seconds   supine with strap    Quad Stretch  Right;2 reps;20 seconds    ITB Stretch  --    Piriformis Stretch  Right;2 reps;30 seconds   supine travell    Gastroc Stretch  Right;Left;2 reps;30 seconds   runners stretch   Other Knee/Hip Stretches  Rt adductor stretch, supine with strap       Knee/Hip Exercises: Aerobic   Stationary Bike  L1: 3.5 min, L2: 1.5 min       Knee/Hip Exercises: Seated   Sit to Sand  10 reps;without UE support   mirror for even WB feedback, to elevated surface     Knee/Hip Exercises: Supine   Quad Sets  Strengthening;Right;5 reps   5 sec hold small towel under knee  to avoid hyperextension    Single Leg Bridge  Right;1 set;10 reps   LLE x 5 reps    Straight Leg Raises  Right;1 set;5 reps    Straight Leg Raise with External Rotation  Strengthening;Right;1 set;10 reps      Knee/Hip Exercises: Sidelying   Hip ABduction  Strengthening;Right;1 set;10 reps    Hip ADduction  Strengthening;Right    Other Sidelying Knee/Hip Exercises  foot taps fwd/back x 10 each LE (fatigues quickly)      Vasopneumatic   Number Minutes Vasopneumatic   10 minutes    Vasopnuematic Location   Knee   Rt    Vasopneumatic Pressure  Low    Vasopneumatic Temperature   34 deg                  PT Long Term Goals - 07/03/18 1213      PT LONG TERM GOAL #1   Title  Decrease pain Rt knee to allow patient to return to all normal functional activities 08/14/2018    Baseline  -    Time  6    Period  Weeks    Status  New      PT LONG TERM GOAL #2   Title  Increased tissue extensibility and ROM Rt LE with patient to demonstrate mobility =/> than the Lt LE 08/14/2018    Baseline  -    Time  6    Period  Weeks    Status  New      PT LONG TERM GOAL #3   Title  Patient  demonstrated normal gait pattern withou tassistive device 08/14/2018    Baseline  -    Time  6    Period  Weeks    Status  New      PT LONG TERM GOAL #4   Title  Independent in HEP 08/14/2018    Time  6    Period  Weeks    Status  New      PT LONG TERM GOAL #5   Title  Improve FOTO to </= 32% limitation 08/14/2018    Time  6    Period  Weeks    Status  New            Plan - 07/15/18 1225    Clinical Impression Statement  Continued weakness in Rt hip with hip abduction, add, and ext exercises; fatigues quickly.  Pt tolerated all exercises with min/ no pain in Rt knee.  Rt quad flexibility has improved.  Pt making good progress towards therapy goals.    PT Frequency  2x / week    PT Duration  6 weeks    PT Treatment/Interventions  Patient/family education;ADLs/Self Care Home Management;Cryotherapy;Electrical Stimulation;Iontophoresis 4mg /ml Dexamethasone;Moist Heat;Ultrasound;Dry needling;Therapeutic activities;Therapeutic exercise;Neuromuscular re-education;Gait training    PT Next Visit Plan  progress with strengthening as tolerated    PT Home Exercise Plan  Access Code: [email or web address]     Consulted and Agree with Plan of Care  Patient       Patient will benefit from skilled therapeutic intervention in order to improve the following deficits and impairments:  Postural dysfunction, Improper body mechanics, Pain, Increased fascial restricitons, Increased muscle spasms, Decreased mobility, Decreased range of motion, Decreased endurance, Decreased activity tolerance, Abnormal gait, Decreased strength  Visit Diagnosis: Acute pain of right knee  Other symptoms and signs involving the musculoskeletal system     Problem List Patient Active Problem List   Diagnosis Date Noted  .  Pes anserinus bursitis of right knee 05/29/2018  . Pain of left heel 04/03/2018  . Wears contact lenses 07/11/2017  . Mild persistent asthma without complication 07/11/2017  . Shellfish allergy 07/11/2017   . 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/2017  . 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  . Elevated blood pressure reading 07/11/2017  . Poor vision 09/28/2013  . 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 ASTHMA 03/05/2010  . DERMATITIS, PERIORAL 03/05/2010  . ACNE VULGARIS, 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   Jon Salazar, PTA 07/15/18 12:51 PM  [facility] [place] [address] [place] [address] [place], [place], [address] Phone: [phone]   Fax:  [phone]  Name: Jon Salazar MRN: [id number] Date of Birth: 02/28/1996

## 2018-07-17 ENCOUNTER — Encounter: Payer: Self-pay | Admitting: Rehabilitative and Restorative Service Providers"

## 2018-07-17 ENCOUNTER — Other Ambulatory Visit: Payer: Self-pay

## 2018-07-17 ENCOUNTER — Ambulatory Visit (INDEPENDENT_AMBULATORY_CARE_PROVIDER_SITE_OTHER): Payer: PRIVATE HEALTH INSURANCE | Admitting: Rehabilitative and Restorative Service Providers"

## 2018-07-17 DIAGNOSIS — M79662 Pain in left lower leg: Secondary | ICD-10-CM

## 2018-07-17 DIAGNOSIS — M25561 Pain in right knee: Secondary | ICD-10-CM

## 2018-07-17 DIAGNOSIS — R29898 Other symptoms and signs involving the musculoskeletal system: Secondary | ICD-10-CM

## 2018-07-17 NOTE — Therapy (Signed)
[facility] [place] [address] [place] [address] [place], [place], [address] Phone: [phone]   Fax:  [phone]  Physical Therapy Treatment  Patient Details  Name: Jon Salazar MRN: [id number] Date of Birth: 1996/02/02 Referring Provider (PT): Dr Benjamin Stain   Encounter Date: 07/17/2018  PT End of Session - 07/17/18 1159    Visit Number  4    Number of Visits  12    Date for PT Re-Evaluation  08/14/18    PT Start Time  1200    PT Stop Time  1254    PT Time Calculation (min)  54 min    Activity Tolerance  Patient tolerated treatment well       Past Medical History:  Diagnosis Date  . Allergy   . Asthma   . Lumbar stress fracture     Past Surgical History:  Procedure Laterality Date  . NO PAST SURGERIES    . UMBILICAL HERNIA REPAIR      There were no vitals filed for this visit.                    [facility] Adult PT Treatment/Exercise - 07/17/18 0001      Knee/Hip Exercises: Stretches   Gastroc Stretch  Right;Left;2 reps;30 seconds      Knee/Hip Exercises: Aerobic   Stationary Bike  L2 x 6 min       Knee/Hip Exercises: Standing   Wall Squat  10 reps   w/ large green ball - 5 sec hold      Knee/Hip Exercises: Supine   Quad Sets  Strengthening;Right;10 reps   5 sec hold noodle under knee to avoid hyperextension    Bridges  Strengthening;Both;10 reps   5 sec hold    Single Leg Bridge  Right;5 reps   5 sec hold    Straight Leg Raises  Right;1 set;10 reps   focus on quad set TKE with lift and lower      Knee/Hip Exercises: Sidelying   Hip ABduction  Strengthening;Right;1 set;10 reps   leading up with heel - hips forward    Other Sidelying Knee/Hip Exercises  foot taps fwd/back x 10 Rt LE       Knee/Hip Exercises: Prone   Hip Extension  Strengthening;Right;10 reps   5 sec hold    Hip Extension Limitations  knee extension in prone toe resting on surface 5 sec x 10 reps     Other Prone Exercises   glut sets 10 sec x 5       Vasopneumatic   Number Minutes Vasopneumatic   15 minutes    Vasopnuematic Location   Knee   Rt    Vasopneumatic Pressure  Low    Vasopneumatic Temperature   34 deg                  PT Long Term Goals - 07/03/18 1213      PT LONG TERM GOAL #1   Title  Decrease pain Rt knee to allow patient to return to all normal functional activities 08/14/2018    Baseline  -    Time  6    Period  Weeks    Status  New      PT LONG TERM GOAL #2   Title  Increased tissue extensibility and ROM Rt LE with patient to demonstrate mobility =/> than the Lt LE 08/14/2018    Baseline  -    Time  6  Period  Weeks    Status  New      PT LONG TERM GOAL #3   Title  Patient demonstrated normal gait pattern withou tassistive device 08/14/2018    Baseline  -    Time  6    Period  Weeks    Status  New      PT LONG TERM GOAL #4   Title  Independent in HEP 08/14/2018    Time  6    Period  Weeks    Status  New      PT LONG TERM GOAL #5   Title  Improve FOTO to </= 32% limitation 08/14/2018    Time  6    Period  Weeks    Status  New            Plan - 07/17/18 1159    Clinical Impression Statement  Some soreness from last therapy session. Both legs are sore. No increase in pain. Added exercises without difficulty.     PT Frequency  2x / week    PT Duration  6 weeks    PT Treatment/Interventions  Patient/family education;ADLs/Self Care Home Management;Cryotherapy;Electrical Stimulation;Iontophoresis 4mg /ml Dexamethasone;Moist Heat;Ultrasound;Dry needling;Therapeutic activities;Therapeutic exercise;Neuromuscular re-education;Gait training    PT Next Visit Plan  progress with strengthening as tolerated    PT Home Exercise Plan  Access Code: [email or web address]     Consulted and Agree with Plan of Care  Patient       Patient will benefit from skilled therapeutic intervention in order to improve the following deficits and impairments:  Postural dysfunction, Improper body  mechanics, Pain, Increased fascial restricitons, Increased muscle spasms, Decreased mobility, Decreased range of motion, Decreased endurance, Decreased activity tolerance, Abnormal gait, Decreased strength  Visit Diagnosis: Acute pain of right knee  Other symptoms and signs involving the musculoskeletal system  Pain in left lower leg     Problem List Patient Active Problem List   Diagnosis Date Noted  . Pes anserinus bursitis of right knee 05/29/2018  . Pain of left heel 04/03/2018  . Wears contact lenses 07/11/2017  . Mild persistent asthma without complication 07/11/2017  . Shellfish allergy 07/11/2017  . 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/2017  . 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  . Elevated blood pressure reading 07/11/2017  . Poor vision 09/28/2013  . 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 ASTHMA 03/05/2010  . DERMATITIS, PERIORAL 03/05/2010  . ACNE VULGARIS, 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    Brandy Kabat Rober MinionP Chanese Hartsough PT, MPH  07/17/2018, 12:44 PM  [facility] [address] [place] [address] [place], [place], [address] Phone: [phone]   Fax:  [phone]  Name: Jon Salazar MRN: [id number] Date of Birth: 10/26/1995

## 2018-07-22 ENCOUNTER — Other Ambulatory Visit: Payer: Self-pay

## 2018-07-22 ENCOUNTER — Ambulatory Visit (INDEPENDENT_AMBULATORY_CARE_PROVIDER_SITE_OTHER): Payer: PRIVATE HEALTH INSURANCE | Admitting: Physical Therapy

## 2018-07-22 ENCOUNTER — Encounter: Payer: Self-pay | Admitting: Physical Therapy

## 2018-07-22 DIAGNOSIS — M25561 Pain in right knee: Secondary | ICD-10-CM

## 2018-07-22 DIAGNOSIS — R29898 Other symptoms and signs involving the musculoskeletal system: Secondary | ICD-10-CM

## 2018-07-22 NOTE — Therapy (Signed)
[place], [place], [address] Phone: [phone]   Fax:  [phone]  Physical Therapy Treatment  Patient Details  Name: Jon Salazar MRN: [id number] Date of Birth: [birth year] Referring Provider (PT): Dr Dianah Field   Encounter Date: 07/22/2018  PT End of Session - 07/22/18 1146    Visit Number  5    Number of Visits  12    Date for PT Re-Evaluation  08/14/18    PT Start Time  1100    PT Stop Time  1151    PT Time Calculation (min)  51 min    Activity Tolerance  Patient tolerated treatment well    Behavior During Therapy  [facility] for tasks assessed/performed       Past Medical History:  Diagnosis Date  . Allergy   . Asthma   . Lumbar stress fracture     Past Surgical History:  Procedure Laterality Date  . NO PAST SURGERIES    . UMBILICAL HERNIA REPAIR      There were no vitals filed for this visit.  Subjective Assessment - 07/22/18 1103    Subjective  Pt reports only occasional pain in Rt knee, but it comes and goes.  He attributes that to muscle soreness, not pain in the joint.      Patient Stated Goals  return to sports and normal life     Currently in Pain?  No/denies    Pain Score  0-No pain    Pain Orientation  Right         [facility] PT Assessment - 07/22/18 0001      Assessment   Medical Diagnosis  Chronic pain Rt knee    Referring Provider (PT)  Dr Dianah Field    Onset Date/Surgical Date  05/17/18   intermittent pain over the past 5 yrs    Hand Dominance  Right;Left    Next MD Visit  08/12/18    Prior Therapy  here for Lt ankle       AROM   Right Knee Flexion  140    Left Knee Flexion  138      Strength   Right Hip ABduction  5/5       [facility] Adult PT Treatment/Exercise - 07/22/18 0001      Knee/Hip Exercises: Stretches   Passive Hamstring Stretch  Right;Left;2 reps;30 seconds    Quad Stretch  Right;Left;2 reps;30 seconds   standing   Piriformis Stretch   Right;Left;30 seconds;2 reps    Gastroc Stretch  Right;Left;2 reps;30 seconds      Knee/Hip Exercises: Aerobic   Stationary Bike  L3: 5 min       Knee/Hip Exercises: Standing   Lateral Step Up  Right;1 set;10 reps;Step Height: 6"   cross over step ups, eccentric lowering    Wall Squat  10 reps   with ball squeeze, 5 sec hold      Knee/Hip Exercises: Supine   Single Leg Bridge  5 reps;Left;Right   5 sec hold (challenging on RLE)   Straight Leg Raise with External Rotation  Strengthening;Right;1 set;10 reps;Left   long sitting, resting on elbows.      Knee/Hip Exercises: Sidelying   Hip ABduction  Strengthening;Left;Right;1 set;10 reps   circles CW/CCW 10 each direction    Other Sidelying Knee/Hip Exercises  foot taps fwd/back x 10 RLE, LLE;      Vasopneumatic   Number Minutes Vasopneumatic   10 minutes    Vasopnuematic  Location   Knee   Rt    Vasopneumatic Pressure  Medium    Vasopneumatic Temperature   34 deg        PT Long Term Goals - 07/22/18 1111      PT LONG TERM GOAL #1   Title  Decrease pain Rt knee to allow patient to return to all normal functional activities 08/14/2018    Time  6    Period  Weeks    Status  Partially Met      PT LONG TERM GOAL #2   Title  Increased tissue extensibility and ROM Rt LE with patient to demonstrate mobility =/> than the Lt LE 08/14/2018    Time  6    Period  Weeks    Status  On-going      PT LONG TERM GOAL #3   Title  Patient demonstrated normal gait pattern without assistive device 08/14/2018    Time  6    Period  Weeks    Status  Achieved      PT LONG TERM GOAL #4   Title  Independent in HEP 08/14/2018    Time  6    Period  Weeks    Status  On-going      PT LONG TERM GOAL #5   Title  Improve FOTO to </= 32% limitation 08/14/2018    Time  6    Period  Weeks    Status  On-going            Plan - 07/22/18 1138    Clinical Impression Statement  Pt demonstrated improved Rt hip strength, and improved Rt knee ROM.  Rt LE continues to fatigue quickly during exercises.  He tolerated all exercises well, without any production of pain. Pt partially met LTG #1, and has met goal #3.        Rehab Potential  Good    PT Frequency  2x / week    PT Duration  6 weeks    PT Treatment/Interventions  Patient/family education;ADLs/Self Care Home Management;Cryotherapy;Electrical Stimulation;Iontophoresis 26m/ml Dexamethasone;Moist Heat;Ultrasound;Dry needling;Therapeutic activities;Therapeutic exercise;Neuromuscular re-education;Gait training    PT Next Visit Plan  progress with strengthening as tolerated    Consulted and Agree with Plan of Care  Patient       Patient will benefit from skilled therapeutic intervention in order to improve the following deficits and impairments:  Postural dysfunction, Improper body mechanics, Pain, Increased fascial restricitons, Increased muscle spasms, Decreased mobility, Decreased range of motion, Decreased endurance, Decreased activity tolerance, Abnormal gait, Decreased strength  Visit Diagnosis: Acute pain of right knee  Other symptoms and signs involving the musculoskeletal system     Problem List Patient Active Problem List   Diagnosis Date Noted  . Pes anserinus bursitis of right knee 05/29/2018  . Pain of left heel 04/03/2018  . Wears contact lenses 07/11/2017  . Mild persistent asthma without complication 074/[id number] . Shellfish allergy 07/11/2017  . 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/2017  . 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  . Elevated blood pressure reading 07/11/2017  . Poor vision 09/28/2013  . 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 ASTHMA 03/05/2010  . DERMATITIS, PERIORAL 03/05/2010  . ACNE VULGARIS, 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   JKerin Perna PTA 07/22/18 12:00 PM  [place] [address]: [phone]  Fax:  [phone] Name: Jon PORTMANMRN: [id number] of Birth: 609/16/1997

## 2018-07-24 ENCOUNTER — Other Ambulatory Visit: Payer: Self-pay

## 2018-07-24 ENCOUNTER — Encounter: Payer: Self-pay | Admitting: Physical Therapy

## 2018-07-24 ENCOUNTER — Ambulatory Visit (INDEPENDENT_AMBULATORY_CARE_PROVIDER_SITE_OTHER): Payer: PRIVATE HEALTH INSURANCE | Admitting: Physical Therapy

## 2018-07-24 DIAGNOSIS — R29898 Other symptoms and signs involving the musculoskeletal system: Secondary | ICD-10-CM

## 2018-07-24 DIAGNOSIS — M25561 Pain in right knee: Secondary | ICD-10-CM | POA: Diagnosis not present

## 2018-07-24 NOTE — Therapy (Signed)
[place], [place], [address] Phone: [phone]   Fax:  [phone]  Physical Therapy Treatment  Patient Details  Name: Jon Salazar MRN: [id number] Date of Birth: [birth year] Referring Provider (PT): Dr Dianah Field   Encounter Date: 07/24/2018  PT End of Session - 07/24/18 1004    Visit Number  6    Number of Visits  12    Date for PT Re-Evaluation  08/14/18    PT Start Time  9528    PT Stop Time  1054    PT Time Calculation (min)  52 min    Activity Tolerance  Patient tolerated treatment well;No increased pain    Behavior During Therapy  WFL for tasks assessed/performed       Past Medical History:  Diagnosis Date  . Allergy   . Asthma   . Lumbar stress fracture     Past Surgical History:  Procedure Laterality Date  . NO PAST SURGERIES    . UMBILICAL HERNIA REPAIR      There were no vitals filed for this visit.  Subjective Assessment - 07/24/18 1004    Subjective  "It's a whole lot better than it was before".  Pt reporting improvement in Rt knee pain and function.  Pt reports he was sore (around VMO) of both knees yesterday; he massaged it, and this helped relieved discomfort.     Currently in Pain?  No/denies    Pain Score  0-No pain         [facility] PT Assessment - 07/24/18 0001      Assessment   Medical Diagnosis  Chronic pain Rt knee    Referring Provider (PT)  Dr Dianah Field    Onset Date/Surgical Date  05/17/18   intermittent pain over the past 5 yrs    Hand Dominance  Right;Left    Next MD Visit  08/12/18    Prior Therapy  here for Lt ankle        [facility] Adult PT Treatment/Exercise - 07/24/18 0001      Knee/Hip Exercises: Stretches   Passive Hamstring Stretch  Right;Left;2 reps;30 seconds    Quad Stretch  Right;Left;2 reps;30 seconds   standing   Piriformis Stretch  Right;Left;30 seconds;2 reps    Gastroc Stretch  2 reps;30 seconds;Both   incline board     Soleus Stretch  Both;2 reps;30 seconds   incline board     Knee/Hip Exercises: Aerobic   Stationary Bike  L3: 5 min       Knee/Hip Exercises: Machines for Strengthening   Total Gym Leg Press  LLE 8 plates x 5 reps, RLE 8 plates x 2 (too difficult) 7 plates x 10.       Knee/Hip Exercises: Standing   Heel Raises  Both;1 set;10 reps   heels off of step, 2nd set single leg x 10   SLS  single leg heel taps (lateral heel tap on 6" step) x 5 reps LLE, 10 reps RLE; single leg forward leans to touch chair seat x 10 LLE, 8 RLE;  SLS on upside down bosu x 30 sec, repeated with slow horiz head turns, each leg.       Knee/Hip Exercises: Sidelying   Other Sidelying Knee/Hip Exercises  side plank x 2 reps each side x 15 sec       Knee/Hip Exercises: Prone   Other Prone Exercises  plank on forearms x 20 sec x 2 reps  Vasopneumatic   Number Minutes Vasopneumatic   10 minutes    Vasopnuematic Location   Knee   Rt    Vasopneumatic Pressure  Medium    Vasopneumatic Temperature   34 deg             PT Education - 07/24/18 1059    Education Details  updated HEP    Person(s) Educated  Patient    Methods  Explanation;Handout;Demonstration;Tactile cues;Verbal cues    Comprehension  Returned demonstration;Verbalized understanding          PT Long Term Goals - 07/22/18 1111      PT LONG TERM GOAL #1   Title  Decrease pain Rt knee to allow patient to return to all normal functional activities 08/14/2018    Time  6    Period  Weeks    Status  Partially Met      PT LONG TERM GOAL #2   Title  Increased tissue extensibility and ROM Rt LE with patient to demonstrate mobility =/> than the Lt LE 08/14/2018    Time  6    Period  Weeks    Status  On-going      PT LONG TERM GOAL #3   Title  Patient demonstrated normal gait pattern without assistive device 08/14/2018    Time  6    Period  Weeks    Status  Achieved      PT LONG TERM GOAL #4   Title  Independent in HEP 08/14/2018     Time  6    Period  Weeks    Status  On-going      PT LONG TERM GOAL #5   Title  Improve FOTO to </= 32% limitation 08/14/2018    Time  6    Period  Weeks    Status  On-going            Plan - 07/24/18 1047    Clinical Impression Statement  Pt tolerated all exercises well, without increase in pain. He was able to complete Lt leg press with 8 plates, but only 7 plates on RLE.  He fatigued quickly with RLE single leg exercises.  Progressing well towards established therapy goals.     Rehab Potential  Good    PT Frequency  2x / week    PT Duration  6 weeks    PT Treatment/Interventions  Patient/family education;ADLs/Self Care Home Management;Cryotherapy;Electrical Stimulation;Iontophoresis 70m/ml Dexamethasone;Moist Heat;Ultrasound;Dry needling;Therapeutic activities;Therapeutic exercise;Neuromuscular re-education;Gait training    PT Next Visit Plan  progress with strengthening as tolerated    PT Home Exercise Plan  Access Code: [email or web address]     Consulted and Agree with Plan of Care  Patient       Patient will benefit from skilled therapeutic intervention in order to improve the following deficits and impairments:  Postural dysfunction, Improper body mechanics, Pain, Increased fascial restricitons, Increased muscle spasms, Decreased mobility, Decreased range of motion, Decreased endurance, Decreased activity tolerance, Abnormal gait, Decreased strength  Visit Diagnosis: Acute pain of right knee  Other symptoms and signs involving the musculoskeletal system     Problem List Patient Active Problem List   Diagnosis Date Noted  . Pes anserinus bursitis of right knee 05/29/2018  . Pain of left heel 04/03/2018  . Wears contact lenses 07/11/2017  . Mild persistent asthma without complication 092/01/9416 . Shellfish allergy 07/11/2017  . 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/2017  . 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  . Elevated blood pressure reading 07/11/2017  .  Poor vision  09/28/2013  . 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 ASTHMA 03/05/2010  . DERMATITIS, PERIORAL 03/05/2010  . ACNE VULGARIS, 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   Kerin Perna, PTA 07/24/18 11:01 AM  [place], [place], [address] Phone: [phone]   Fax:  [phone]  Name: Jon Salazar MRN: [id number] Date of Birth: 09-09-1995

## 2018-07-24 NOTE — Patient Instructions (Signed)
Access Code: [facility]  URL: [email or web address]  Date: 07/24/2018  Prepared by: Mayer Camel   Exercises  Straight Leg Raise with External Rotation - 10 reps - 1-2 sets - 5 sec hold - 2x daily - 7x weekly  Clamshell with Resistance - 10 reps - 1-2 sets - 5 sec hold - 2x daily - 7x weekly  Sidelying Hip Abduction - 10 reps - 1-2 sets - 30 sec hold - 2x daily - 7x weekly  Single Leg Bridge - 10 reps - 1-2 sets - 1x daily - 3x weekly  Single Leg Heel Raise - 10 reps - 2 sets - 1x daily - 3x weekly  Forward T - 10 reps - 1 sets - 1x daily - 3x weekly  Single-Leg Quarter Squat - 10 reps - 2 sets - 1x daily - 3x weekly

## 2018-07-28 ENCOUNTER — Ambulatory Visit (INDEPENDENT_AMBULATORY_CARE_PROVIDER_SITE_OTHER): Payer: PRIVATE HEALTH INSURANCE | Admitting: Rehabilitative and Restorative Service Providers"

## 2018-07-28 ENCOUNTER — Other Ambulatory Visit: Payer: Self-pay

## 2018-07-28 ENCOUNTER — Encounter: Payer: Self-pay | Admitting: Rehabilitative and Restorative Service Providers"

## 2018-07-28 DIAGNOSIS — M25561 Pain in right knee: Secondary | ICD-10-CM

## 2018-07-28 DIAGNOSIS — M79662 Pain in left lower leg: Secondary | ICD-10-CM

## 2018-07-28 DIAGNOSIS — R29898 Other symptoms and signs involving the musculoskeletal system: Secondary | ICD-10-CM | POA: Diagnosis not present

## 2018-07-28 NOTE — Therapy (Signed)
[place], [place], [address] Phone: [phone]   Fax:  [phone]  Physical Therapy Treatment  Patient Details  Name: Jon Salazar MRN: [id number] Date of Birth: [birth year] Referring Provider (PT): Dr Dianah Field   Encounter Date: 07/28/2018  PT End of Session - 07/28/18 1107    Visit Number  7    Number of Visits  12    Date for PT Re-Evaluation  08/14/18    PT Start Time  1100    PT Stop Time  1540    PT Time Calculation (min)  56 min    Activity Tolerance  Patient tolerated treatment well       Past Medical History:  Diagnosis Date  . Allergy   . Asthma   . Lumbar stress fracture     Past Surgical History:  Procedure Laterality Date  . NO PAST SURGERIES    . UMBILICAL HERNIA REPAIR      There were no vitals filed for this visit.  Subjective Assessment - 07/28/18 1114    Subjective  leg is sore but not painful - sore from exercises    Currently in Pain?  No/denies                       [facility] Adult PT Treatment/Exercise - 07/28/18 0001      Knee/Hip Exercises: Stretches   Passive Hamstring Stretch  Right;Left;2 reps;30 seconds    Quad Stretch  Right;Left;2 reps;30 seconds   standing   Piriformis Stretch  Right;Left;30 seconds;2 reps    Gastroc Stretch  2 reps;30 seconds;Both   incline board    Soleus Stretch  Both;2 reps;30 seconds   incline board     Knee/Hip Exercises: Aerobic   Stationary Bike  L3: 5 min       Knee/Hip Exercises: Machines for Strengthening   Total Gym Leg Press  Rt/Lt LE 7 plates 10 reps x 2 sets each LE       Knee/Hip Exercises: Standing   SLS  SLS on blue foam cushion 20-40 sec' x 5 reps each LE; SLS with reach opposite foot forward/side/back initially tapping toe x 5 reps then wthout toe touch x 5 reps     Other Standing Knee Exercises  step stop in semi-squat position fwd/back; side to side x 2 each ~ 20 ft      Knee/Hip Exercises: Supine   Single Leg Bridge  Left;Right;10 reps;2 sets   5 sec hold      Knee/Hip Exercises: Sidelying   Other Sidelying Knee/Hip Exercises  side plank x 2 reps each side x 20 sec       Vasopneumatic   Number Minutes Vasopneumatic   15 minutes    Vasopnuematic Location   Knee   Rt    Vasopneumatic Pressure  Medium    Vasopneumatic Temperature   34 deg                  PT Long Term Goals - 07/22/18 1111      PT LONG TERM GOAL #1   Title  Decrease pain Rt knee to allow patient to return to all normal functional activities 08/14/2018    Time  6    Period  Weeks    Status  Partially Met      PT LONG TERM GOAL #2   Title  Increased tissue extensibility and ROM Rt LE with patient to demonstrate mobility =/>  than the Lt LE 08/14/2018    Time  6    Period  Weeks    Status  On-going      PT LONG TERM GOAL #3   Title  Patient demonstrated normal gait pattern without assistive device 08/14/2018    Time  6    Period  Weeks    Status  Achieved      PT LONG TERM GOAL #4   Title  Independent in HEP 08/14/2018    Time  6    Period  Weeks    Status  On-going      PT LONG TERM GOAL #5   Title  Improve FOTO to </= 32% limitation 08/14/2018    Time  6    Period  Weeks    Status  On-going            Plan - 07/28/18 1107    Clinical Impression Statement  No pain - just soreness in the Rt knee from exercises. Added balance and strengthening exercises without difficulty. Continues to progress with strengthening and stabilization Rt LE.     Rehab Potential  Good    PT Frequency  2x / week    PT Duration  6 weeks    PT Treatment/Interventions  Patient/family education;ADLs/Self Care Home Management;Cryotherapy;Electrical Stimulation;Iontophoresis 22m/ml Dexamethasone;Moist Heat;Ultrasound;Dry needling;Therapeutic activities;Therapeutic exercise;Neuromuscular re-education;Gait training    PT Next Visit Plan  progress with strengthening as tolerated    PT  Home Exercise Plan  Access Code: [email or web address]     Consulted and Agree with Plan of Care  Patient       Patient will benefit from skilled therapeutic intervention in order to improve the following deficits and impairments:  Postural dysfunction, Improper body mechanics, Pain, Increased fascial restricitons, Increased muscle spasms, Decreased mobility, Decreased range of motion, Decreased endurance, Decreased activity tolerance, Abnormal gait, Decreased strength  Visit Diagnosis: Acute pain of right knee  Other symptoms and signs involving the musculoskeletal system  Pain in left lower leg     Problem List Patient Active Problem List   Diagnosis Date Noted  . Pes anserinus bursitis of right knee 05/29/2018  . Pain of left heel 04/03/2018  . Wears contact lenses 07/11/2017  . Mild persistent asthma without complication 067/61/9509 . Shellfish allergy 07/11/2017  . 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/2017  . 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  . Elevated blood pressure reading 07/11/2017  . Poor vision 09/28/2013  . 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 ASTHMA 03/05/2010  . DERMATITIS, PERIORAL 03/05/2010  . ACNE VULGARIS, 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    Othella Slappey PNilda SimmerPT, MPH  07/28/2018, 11:51 AM  [facility] [address]Phone: [phone]  Fax:  [phone] Name: Jon GIPEMRN: [id number] of Birth: 6May 05, 1997

## 2018-07-31 ENCOUNTER — Encounter: Payer: Self-pay | Admitting: Physical Therapy

## 2018-08-05 ENCOUNTER — Encounter: Payer: Self-pay | Admitting: Physical Therapy

## 2018-08-05 ENCOUNTER — Ambulatory Visit (INDEPENDENT_AMBULATORY_CARE_PROVIDER_SITE_OTHER): Payer: PRIVATE HEALTH INSURANCE | Admitting: Physical Therapy

## 2018-08-05 ENCOUNTER — Other Ambulatory Visit: Payer: Self-pay

## 2018-08-05 DIAGNOSIS — R29898 Other symptoms and signs involving the musculoskeletal system: Secondary | ICD-10-CM

## 2018-08-05 DIAGNOSIS — M25561 Pain in right knee: Secondary | ICD-10-CM

## 2018-08-05 NOTE — Therapy (Addendum)
[place], [place], [address] Phone: [phone]   Fax:  [phone]  Physical Therapy Treatment  Patient Details  Name: Jon Salazar MRN: [id number] Date of Birth: 1995/07/02 Referring Provider (PT): Dr Dianah Field   Encounter Date: 08/05/2018  PT End of Session - 08/05/18 1400    Visit Number  8    Number of Visits  12    Date for PT Re-Evaluation  08/14/18    PT Start Time  1400    PT Stop Time  1445    PT Time Calculation (min)  45 min    Activity Tolerance  Patient tolerated treatment well    Behavior During Therapy  [facility] for tasks assessed/performed       Past Medical History:  Diagnosis Date  . Allergy   . Asthma   . Lumbar stress fracture     Past Surgical History:  Procedure Laterality Date  . NO PAST SURGERIES    . UMBILICAL HERNIA REPAIR      There were no vitals filed for this visit.  Subjective Assessment - 08/05/18 1429    Subjective  Pt reports he has occasional twinges of pain in Rt knee, if he plants and twists or if he "takes an awkward step".  Pain resolves quickly.  Otherwise, Rt knee has felt good.     Pertinent History  History of Rt knee pain diagnosed as tendinitis; Lt aknle severe sprain 11/19 and 6/19 with history ot Lt ankle sprain 3-4 times in the past 5 yrs; LBP ~ fx of lumbar spine 2015      Diagnostic tests  xrays; MRI; [place]  - MRI - Chondral fissuring, subchondral edema in the trochlear groove and the posterior medial tibial plateau.    Patient Stated Goals  return to sports and normal life     Currently in Pain?  No/denies    Pain Score  0-No pain         [facility] PT Assessment - 08/05/18 0001      Assessment   Medical Diagnosis  Chronic pain Rt knee    Referring Provider (PT)  Dr Dianah Field    Onset Date/Surgical Date  05/17/18   intermittent pain over the past 5 yrs    Hand Dominance  Right;Left    Next MD Visit  08/12/18    Prior Therapy   here for Lt ankle       Observation/Other Assessments   Focus on Therapeutic Outcomes (FOTO)   22% limitation       Strength   Strength Assessment Site  Knee;Hip    Right/Left Hip  Right    Right Hip Flexion  5/5    Right Hip Extension  5/5    Right Hip External Rotation   --   5-/5   Right Hip Internal Rotation  --   5-/5   Right Hip ABduction  --   5-/5   Right Hip ADduction  5/5    Right/Left Knee  Right    Right Knee Flexion  --   5-/5   Right Knee Extension  5/5       [facility] Adult PT Treatment/Exercise - 08/05/18 0001      Knee/Hip Exercises: Stretches   Passive Hamstring Stretch  Right;Left;2 reps;30 seconds    Quad Stretch  Right;Left;30 seconds;3 reps   standing   Gastroc Stretch  30 seconds;Both;3 reps   incline board      Knee/Hip Exercises:  Aerobic   Stationary Bike  L4-7: 5 min       Knee/Hip Exercises: Machines for Strengthening   Total Gym Leg Press  LLE 8 plates x 5 reps, RLE 8 plates x 5 (challenging) 7 plates x 10 RLE.       Knee/Hip Exercises: Standing   Heel Raises  Left;Right;1 set;15 reps   heel off step   Lunge Walking - Round Trips  50 ft     SLS  SLS on upside down bosu x 1 min each leg, with horiz head turns     Other Standing Knee Exercises  step stop in semi-squat position fwd/back.    Other Standing Knee Exercises  side step squat holding 10# weight x 25 ft each direction.       Knee/Hip Exercises: Sidelying   Hip ABduction  --   reviewed HEP exercises verbally   Hip ABduction Limitations  5 reps of RLE hip abdct isometric x 5 sec (pressure into PTA hands)      Modalities   Modalities  --   pt declined; will ice at home.                  PT Long Term Goals - 08/05/18 1511      PT LONG TERM GOAL #1   Title  Decrease pain Rt knee to allow patient to return to all normal functional activities 08/14/2018    Time  6    Period  Weeks    Status  Partially Met      PT LONG TERM GOAL #2   Title  Increased tissue  extensibility and ROM Rt LE with patient to demonstrate mobility =/> than the Lt LE 08/14/2018    Time  6    Period  Weeks    Status  Partially Met      PT LONG TERM GOAL #3   Title  Patient demonstrated normal gait pattern without assistive device 08/14/2018    Time  6    Period  Weeks    Status  Achieved      PT LONG TERM GOAL #4   Title  Independent in HEP 08/14/2018    Time  6    Period  Weeks    Status  On-going      PT LONG TERM GOAL #5   Title  Improve FOTO to </= 32% limitation 08/14/2018    Time  6    Period  Weeks    Status  Achieved            Plan - 08/05/18 1509    Clinical Impression Statement  Improved FOTO score today 22% limitation (was 54% on intake).  Pt tolerated all exercises without any production of pain or difficulty.  RLE strength has improved.  Pt is near meeting remaining goals. Returns to MD for follow up next week.     Rehab Potential  Good    PT Frequency  2x / week    PT Duration  6 weeks    PT Treatment/Interventions  Patient/family education;ADLs/Self Care Home Management;Cryotherapy;Electrical Stimulation;Iontophoresis 80m/ml Dexamethasone;Moist Heat;Ultrasound;Dry needling;Therapeutic activities;Therapeutic exercise;Neuromuscular re-education;Gait training    PT Next Visit Plan  will hold therapy until pt visits with MD; may see pt for agility training once cleared.     PT Home Exercise Plan  Access Code: [email or web address]     Consulted and Agree with Plan of Care  Patient       Patient will benefit from skilled therapeutic  intervention in order to improve the following deficits and impairments:  Postural dysfunction, Improper body mechanics, Pain, Increased fascial restricitons, Increased muscle spasms, Decreased mobility, Decreased range of motion, Decreased endurance, Decreased activity tolerance, Abnormal gait, Decreased strength  Visit Diagnosis: Acute pain of right knee  Other symptoms and signs involving the musculoskeletal  system     Problem List Patient Active Problem List   Diagnosis Date Noted  . Pes anserinus bursitis of right knee 05/29/2018  . Pain of left heel 04/03/2018  . Wears contact lenses 07/11/2017  . Mild persistent asthma without complication 37/79/3968  . Shellfish allergy 07/11/2017  . 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/2017  . 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  . Elevated blood pressure reading 07/11/2017  . Poor vision 09/28/2013  . 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 ASTHMA 03/05/2010  . DERMATITIS, PERIORAL 03/05/2010  . ACNE VULGARIS, 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   Kerin Perna, PTA 08/05/18 3:14 PM  [facility] [place], [place], [address] Phone: [phone]   Fax:  [phone]  Name: Jon Salazar MRN: [id number] Date of Birth: 17-Apr-1995  PHYSICAL THERAPY DISCHARGE SUMMARY  Visits from Start of Care: 8  Current functional level related to goals / functional outcomes: See last progress note for discharge status    Remaining deficits: Doing well at d/c    Education / Equipment: HEP  Plan: Patient agrees to discharge.  Patient goals were met. Patient is being discharged due to meeting the stated rehab goals.  ?????    Celyn P. Helene Kelp PT, MPH 09/03/18 9:43 AM

## 2018-08-11 ENCOUNTER — Encounter: Payer: Self-pay | Admitting: Physician Assistant

## 2018-08-11 ENCOUNTER — Encounter: Payer: Managed Care, Other (non HMO) | Admitting: Physician Assistant

## 2018-08-11 ENCOUNTER — Ambulatory Visit (INDEPENDENT_AMBULATORY_CARE_PROVIDER_SITE_OTHER): Payer: PRIVATE HEALTH INSURANCE | Admitting: Physician Assistant

## 2018-08-11 VITALS — BP 121/75 | HR 72 | Temp 98.5°F | Wt 165.0 lb

## 2018-08-11 DIAGNOSIS — Z130 Encounter for screening for diseases of the blood and blood-forming organs and certain disorders involving the immune mechanism: Secondary | ICD-10-CM | POA: Diagnosis not present

## 2018-08-11 DIAGNOSIS — Z23 Encounter for immunization: Secondary | ICD-10-CM | POA: Diagnosis not present

## 2018-08-11 DIAGNOSIS — Z1389 Encounter for screening for other disorder: Secondary | ICD-10-CM

## 2018-08-11 DIAGNOSIS — Z131 Encounter for screening for diabetes mellitus: Secondary | ICD-10-CM

## 2018-08-11 DIAGNOSIS — Z0000 Encounter for general adult medical examination without abnormal findings: Secondary | ICD-10-CM

## 2018-08-11 DIAGNOSIS — R030 Elevated blood-pressure reading, without diagnosis of hypertension: Secondary | ICD-10-CM

## 2018-08-11 NOTE — Patient Instructions (Addendum)
Preventive Care for [place], Male The transition to life after high school as a young adult can be a stressful time with many changes. You may start seeing a primary care physician instead of a pediatrician. This is the time when your health care becomes your responsibility. Preventive care refers to lifestyle choices and visits with your health care provider that can promote health and wellness. What does preventive care include?  A yearly physical exam. This is also called an annual wellness visit.  Dental exams once or twice a year.  Routine eye exams. Ask your health care provider how often you should have your eyes checked.  Personal lifestyle choices, including: ? Daily care of your teeth and gums. ? Regular physical activity. ? Eating a healthy diet. ? Avoiding tobacco and drug use. ? Avoiding or limiting alcohol use. ? Practicing safe sex. What happens during an annual wellness visit? Preventive care starts with a yearly visit to your primary care physician. The services and screenings done by your health care provider during your annual wellness visit will depend on your overall health, lifestyle risk factors, and family history of disease. Counseling Your health care provider may ask you questions about:  Past medical problems and your family's medical history.  Medicines or supplements that you take.  Health insurance and access to health care.  Alcohol, tobacco, and drug use, including use of any bodybuilding drugs (anabolic steroids).  Your safety at home, work, or school.  Access to firearms.  Emotional well-being and how you cope with stress.  Relationship well-being.  Diet, exercise, and sleep habits.  Your sexual health and activity. Screening You may have the following tests or measurements:  Height, weight, and BMI.  Blood pressure.  Lipid and cholesterol levels.  Tuberculosis skin test.  Skin exam.  Vision and hearing tests.  Genital  exam to check for testicular cancer or hernias.  Screening test for hepatitis.  Screening tests for STDs (sexually transmitted diseases), if you are at risk. Vaccines Your health care provider may recommend certain vaccines, such as:  Influenza vaccine. This is recommended every year.  Tetanus, diphtheria, and acellular pertussis (Tdap, Td) vaccine. You may need a Td booster every 10 years.  Varicella vaccine. You may need this if you have not been vaccinated.  HPV vaccine. If you are 100 or younger, you may need three doses over 6 months.  Measles, mumps, and rubella (MMR) vaccine. You may need at least one dose of MMR. You may also need a second dose.  Pneumococcal 13-valent conjugate (PCV13) vaccine. You may need this if you have certain conditions and have not been vaccinated.  Pneumococcal polysaccharide (PPSV23) vaccine. You may need one or two doses if you smoke cigarettes or if you have certain conditions.  Meningococcal vaccine. One dose is recommended if you are age 48-21 years and a first-year college student living in a residence hall, or if you have one of several medical conditions. You may also need additional booster doses.  Hepatitis A vaccine. You may need this if you have certain conditions or if you travel or work in places where you may be exposed to hepatitis A.  Hepatitis B vaccine. You may need this if you have certain conditions or if you travel or work in places where you may be exposed to hepatitis B.  Haemophilus influenzae type b (Hib) vaccine. You may need this if you have certain risk factors. Talk to your health care provider about which screenings and vaccines  you need and how often you need them. What steps can I take to develop healthy behaviors?      Have regular preventive health care visits with your primary care physician and dentist.  Eat a healthy diet.  Drink enough fluid to keep your urine clear or pale yellow.  Stay active. Exercise  at least 30 minutes 5 or more days of the week.  Use alcohol responsibly.  Maintain a healthy weight.  Do not use any products that contain nicotine, such as cigarettes, chewing tobacco, and e-cigarettes. If you need help quitting, ask your health care provider.  Do not use drugs.  Practice safe sex. This includes using condoms to prevent STDs or an unwanted pregnancy.  Find healthy ways to manage stress. How can I protect myself from injury? Injuries from violence or accidents are the leading cause of death among young adults and can often be prevented. Take these steps to help protect yourself:  Always wear your seat belt while driving or riding in a vehicle.  Do not drive if you have been drinking alcohol. Do not ride with someone who has been drinking.  Do not drive when you are tired or distracted. Do not text while driving.  Wear a helmet and other protective equipment during sports activities.  If you have firearms in your house, make sure you follow all gun safety procedures.  Seek help if you have been bullied, physically abused, or sexually abused.  Avoid fighting.  Use the Internet responsibly to avoid dangers such as online bullying. What can I do to cope with stress? Young adults may face many new challenges that can be stressful, such as finding a job, going to college, moving away from home, managing money, being in a relationship, getting married, and having children. To manage stress:  Avoid known stressful situations when you can.  Exercise regularly.  Find a stress-reducing activity that works best for you. Examples include meditation, yoga, listening to music, or reading.  Spend time in nature.  Keep a journal to write about your stress and how you respond.  Talk to your health care provider about stress. He or she may suggest counseling.  Spend time with supportive friends or family.  Do not cope with stress by: ? Drinking alcohol or using drugs.  ? Smoking cigarettes. ? Eating. Where can I get more information? Learn more about preventive care and healthy habits from:  U.S. Preventive Services Task Force: StageSync.si  National Adolescent and [place]: StrategicRoad.nl  American Academy of Pediatrics Bright Futures: https://brightfutures.MemberVerification.co.za  Society for Adolescent Health and Medicine: MoralBlog.co.za.aspx  PodExchange.nl: ToyLending.fr This information is not intended to replace advice given to you by your health care provider. Make sure you discuss any questions you have with your health care provider. Document Released: 07/06/2015 Document Revised: 10/01/2016 Document Reviewed: 07/06/2015 Elsevier Interactive Patient Education  2019 Elsevier Inc.   Preventing Hypertension Hypertension, commonly called high blood pressure, is when the force of blood pumping through the arteries is too strong. Arteries are blood vessels that carry blood from the heart throughout the body. Over time, hypertension can damage the arteries and decrease blood flow to important parts of the body, including the brain, heart, and kidneys. Often, hypertension does not cause symptoms until blood pressure is very high. For this reason, it is important to have your blood pressure checked on a regular basis. Hypertension can often be prevented with diet and lifestyle changes. If you already have hypertension, you  can control it with diet and lifestyle changes, as well as medicine. What nutrition changes can be made? Maintain a healthy diet. This includes:  Eating less salt (sodium). Ask your health care provider how much sodium is safe for you to have. The general recommendation is to consume less than 1 tsp  (2,300 mg) of sodium a day. ? Do not add salt to your food. ? Choose low-sodium options when grocery shopping and eating out.  Limiting fats in your diet. You can do this by eating low-fat or fat-free dairy products and by eating less red meat.  Eating more fruits, vegetables, and whole grains. Make a goal to eat: ? 1-2 cups of fresh fruits and vegetables each day. ? 3-4 servings of whole grains each day.  Avoiding foods and beverages that have added sugars.  Eating fish that contain healthy fats (omega-3 fatty acids), such as mackerel or salmon. If you need help putting together a healthy eating plan, try the DASH diet. This diet is high in fruits, vegetables, and whole grains. It is low in sodium, red meat, and added sugars. DASH stands for Dietary Approaches to Stop Hypertension. What lifestyle changes can be made?   Lose weight if you are overweight. Losing just 3?5% of your body weight can help prevent or control hypertension. ? For example, if your present weight is 200 lb (91 kg), a loss of 3-5% of your weight means losing 6-10 lb (2.7-4.5 kg). ? Ask your health care provider to help you with a diet and exercise plan to safely lose weight.  Get enough exercise. Do at least 150 minutes of moderate-intensity exercise each week. ? You could do this in short exercise sessions several times a day, or you could do longer exercise sessions a few times a week. For example, you could take a brisk 10-minute walk or bike ride, 3 times a day, for 5 days a week.  Find ways to reduce stress, such as exercising, meditating, listening to music, or taking a yoga class. If you need help reducing stress, ask your health care provider.  Do not smoke. This includes e-cigarettes. Chemicals in tobacco and nicotine products raise your blood pressure each time you smoke. If you need help quitting, ask your health care provider.  Avoid alcohol. If you drink alcohol, limit alcohol intake to no more than 1  drink a day for nonpregnant women and 2 drinks a day for men. One drink equals 12 oz of beer, 5 oz of wine, or 1 oz of hard liquor. Why are these changes important? Diet and lifestyle changes can help you prevent hypertension, and they may make you feel better overall and improve your quality of life. If you have hypertension, making these changes will help you control it and help prevent major complications, such as:  Hardening and narrowing of arteries that supply blood to: ? Your heart. This can cause a heart attack. ? Your brain. This can cause a stroke. ? Your kidneys. This can cause kidney failure.  Stress on your heart muscle, which can cause heart failure. What can I do to lower my risk?  Work with your health care provider to make a hypertension prevention plan that works for you. Follow your plan and keep all follow-up visits as told by your health care provider.  Learn how to check your blood pressure at home. Make sure that you know your personal target blood pressure, as told by your health care provider. How is this treated? In  addition to diet and lifestyle changes, your health care provider may recommend medicines to help lower your blood pressure. You may need to try a few different medicines to find what works best for you. You also may need to take more than one medicine. Take over-the-counter and prescription medicines only as told by your health care provider. Where to find support Your health care provider can help you prevent hypertension and help you keep your blood pressure at a healthy level. Your local hospital or your community may also provide support services and prevention programs. The American Heart Association offers an online support network at: CheapBootlegs.com.cy Where to find more information Learn more about hypertension from:  [place], Lung, and Blood Institute: ElectronicHangman.is   Centers for Disease Control and Prevention: https://ingram.com/  American Academy of Family Physicians: http://familydoctor.org/familydoctor/en/diseases-conditions/high-blood-pressure.printerview.all.html Learn more about the DASH diet from:  [place], Lung, and [place]: https://www.reyes.com/ Contact a health care provider if:  You think you are having a reaction to medicines you have taken.  You have recurrent headaches or feel dizzy.  You have swelling in your ankles.  You have trouble with your vision. Summary  Hypertension often does not cause any symptoms until blood pressure is very high. It is important to get your blood pressure checked regularly.  Diet and lifestyle changes are the most important steps in preventing hypertension.  By keeping your blood pressure in a healthy range, you can prevent complications like heart attack, heart failure, stroke, and kidney failure.  Work with your health care provider to make a hypertension prevention plan that works for you. This information is not intended to replace advice given to you by your health care provider. Make sure you discuss any questions you have with your health care provider. Document Released: 03/05/2015 Document Revised: 10/30/2015 Document Reviewed: 10/30/2015 Elsevier Interactive Patient Education  2019 [employer].

## 2018-08-11 NOTE — Progress Notes (Signed)
HPI:                                                                Jon Salazar is a 23 y.o. male who [facility] today for annual physical exam  Current Concerns include: none  Hx of elevated BP readings in the office. He reports he was monitoring his BP at home and readings were <130/80. He lost his recordings.  Health Maintenance Health Maintenance  Topic Date Due  . HIV Screening  08/09/2011  . INFLUENZA VACCINE  10/03/2018  . TETANUS/TDAP  10/24/2021    Depression screen PHQ 2/9 08/11/2018 01/14/2018 07/11/2017  Decreased Interest 0 0 0  Down, Depressed, Hopeless 0 0 0  PHQ - 2 Score 0 0 0    Past Medical History:  Diagnosis Date  . Allergy   . Asthma   . Lumbar stress fracture    Past Surgical History:  Procedure Laterality Date  . NO PAST SURGERIES    . UMBILICAL HERNIA REPAIR     Social History   Tobacco Use  . Smoking status: Never Smoker  . Smokeless tobacco: Never Used  Substance Use Topics  . Alcohol use: No   family history includes Hypertension in his mother.  ROS: negative except as noted in the HPI  Medications: Current Outpatient Medications  Medication Sig Dispense Refill  . albuterol (PROVENTIL HFA;VENTOLIN HFA) 108 (90 Base) MCG/ACT inhaler Inhale 2 puffs, 15 minutes prior to exertion/exercise. Inhale 1-2 puffs Q4H prn for wheezing/SOB 1 Inhaler 3  . EPINEPHrine 0.3 mg/0.3 mL IJ SOAJ injection Inject 0.3 mLs (0.3 mg total) into the muscle once as needed for up to 1 dose (anaphylactic reaction). 2 Device 2   No current facility-administered medications for this visit.    Allergies  Allergen Reactions  . Peanut-Containing Drug Products   . Shellfish Allergy        Objective:  BP (!) 147/80   Pulse 72   Temp 98.5 F (36.9 C) (Oral)   Wt 165 lb (74.8 kg)   BMI 20.90 kg/m  Vitals:   08/11/18 1523 08/11/18 1541  BP: (!) 147/80 121/75  Pulse: 72   Temp: 98.5  F (36.9 C)   General Appearance:  Alert, cooperative, no distress, appropriate for age                            Head:  Normocephalic, without obvious abnormality                             Eyes:  PERRL, EOM's intact, conjunctiva and cornea clear                             Ears:  TM pearly gray color and semitransparent, external ear canals normal, both ears                            Nose:  Nares symmetrical, mucosa pink  Throat:  Lips, tongue, and mucosa are moist, pink, and intact; oropharynx clear, uvula midline; good dentition                             Neck:  Supple; symmetrical, trachea midline, no adenopathy; thyroid: no enlargement, symmetric, no tenderness/mass/nodules                             Back:  Symmetrical, no curvature, ROM normal               Chest/Breast:  No tenderness, masses or nipple abnormality. No dimpling or discharge                           Lungs:  Clear to auscultation bilaterally, respirations unlabored                             Heart:  normal rate & regular rhythm, S1 and S2 normal, no murmurs, rubs, or gallops                     Abdomen:  Soft, non-tender, no mass or organomegaly              Genitourinary:  deferred         Musculoskeletal:  Tone and strength strong and symmetrical, all extremities; no joint pain or edema, normal gait and station                                  Lymphatic:  No adenopathy             Skin/Hair/Nails:  Skin warm, dry and intact, no rashes or abnormal dyspigmentation on limited exam                   Neurologic:  Alert and oriented x3, no cranial nerve deficits, sensation grossly intact, normal gait and station, no tremor Psych: well-groomed, cooperative, good eye contact, euthymic mood, affect mood-congruent, speech is articulate, and thought processes clear and goal-directed   BP Readings from Last 3 Encounters:  08/11/18 121/75  07/15/18 (!) 142/80  07/01/18 (!) 160/85    No results  found for this or any previous visit (from the past 72 hour(s)). No results found.    Assessment and Plan: 23 y.o. male with   .Jon Salazar was seen today for annual exam.  Diagnoses and all orders for this visit:  Encounter for annual physical exam -     CBC -     COMPLETE METABOLIC PANEL WITH GFR -     Urinalysis, Routine w reflex microscopic  Screening for blood or protein in urine -     Urinalysis, Routine w reflex microscopic  Screening for blood disease -     CBC -     COMPLETE METABOLIC PANEL WITH GFR  Screening for diabetes mellitus -     COMPLETE METABOLIC PANEL WITH GFR     - Personally reviewed PMH, PSH, PFH, medications, allergies, HM - Age-appropriate cancer screening: n/a - Immunizations - reviewed. Gardasil given today - PHQ2 negative - Routine fasting labs pending - Declined STI screening - Elevated BP on initial screening. Improved on re-check. Patient completed home BP monitoring and home readings were in range. CMP  and UA pending. He was encouraged to continue self-monitoring at least 1-2 times per month. Counseled on DASH eating plan and preventing HTN  Patient education and anticipatory guidance given Patient agrees with treatment plan Follow-up nurse visit in 2 months/6mos for HPV#2/#3, in 1 year for CPE or sooner as needed  Darlyne Russian PA-C

## 2018-08-12 ENCOUNTER — Ambulatory Visit (INDEPENDENT_AMBULATORY_CARE_PROVIDER_SITE_OTHER): Payer: PRIVATE HEALTH INSURANCE | Admitting: Sports Medicine

## 2018-08-12 ENCOUNTER — Ambulatory Visit: Payer: Managed Care, Other (non HMO) | Admitting: Sports Medicine

## 2018-08-12 ENCOUNTER — Encounter: Payer: Self-pay | Admitting: Sports Medicine

## 2018-08-12 DIAGNOSIS — M2241 Chondromalacia patellae, right knee: Secondary | ICD-10-CM | POA: Diagnosis not present

## 2018-08-12 LAB — COMPLETE METABOLIC PANEL WITH GFR
AG Ratio: 1.8 (calc) (ref 1.0–2.5)
ALT: 20 U/L (ref 9–46)
AST: 17 U/L (ref 10–40)
Albumin: 4.8 g/dL (ref 3.6–5.1)
Alkaline phosphatase (APISO): 71 U/L (ref 36–130)
BUN: 13 mg/dL (ref 7–25)
CO2: 30 mmol/L (ref 20–32)
Calcium: 9.9 mg/dL (ref 8.6–10.3)
Chloride: 103 mmol/L (ref 98–110)
Creat: 1.2 mg/dL (ref 0.60–1.35)
GFR, Est African American: 99 mL/min/{1.73_m2} (ref >=60)
GFR, Est Non African American: 85 mL/min/{1.73_m2} (ref >=60)
Globulin: 2.7 g/dL (calc) (ref 1.9–3.7)
Glucose, Bld: 87 mg/dL (ref 65–99)
Potassium: 4 mmol/L (ref 3.5–5.3)
Sodium: 140 mmol/L (ref 135–146)
Total Bilirubin: 0.5 mg/dL (ref 0.2–1.2)
Total Protein: 7.5 g/dL (ref 6.1–8.1)

## 2018-08-12 LAB — CBC
HCT: 46.3 % (ref 38.5–50.0)
Hemoglobin: 15.4 g/dL (ref 13.2–17.1)
MCH: 28.7 pg (ref 27.0–33.0)
MCHC: 33.3 g/dL (ref 32.0–36.0)
MCV: 86.4 fL (ref 80.0–100.0)
MPV: 10.2 fL (ref 7.5–12.5)
Platelets: 297 10*3/uL (ref 140–400)
RBC: 5.36 10*6/uL (ref 4.20–5.80)
RDW: 11.7 % (ref 11.0–15.0)
WBC: 5.7 10*3/uL (ref 3.8–10.8)

## 2018-08-12 LAB — URINALYSIS, ROUTINE W REFLEX MICROSCOPIC
Bilirubin Urine: NEGATIVE
Glucose, UA: NEGATIVE
Hgb urine dipstick: NEGATIVE
Ketones, ur: NEGATIVE
Leukocytes,Ua: NEGATIVE
Nitrite: NEGATIVE
Protein, ur: NEGATIVE
Specific Gravity, Urine: 1.024 (ref 1.001–1.03)
pH: 6.5 (ref 5.0–8.0)

## 2018-08-12 NOTE — Assessment & Plan Note (Signed)
Essentially resolved after injection and formal PT. He is graduating to a home exercise program, continue these exercises indefinitely. Wear the brace when playing basketball, avoid prolonged and deep flexion. Return to see me as needed.

## 2018-08-12 NOTE — Progress Notes (Signed)
Subjective:    CC: Follow-up  HPI: Jon Salazar returns, he is a pleasant 23 year old male, we have been treating him aggressively for right patellofemoral chondromalacia, he had an injection, aggressive PT, and is completely pain-free now.  He gets an occasional twinge but overall is happy with how things are going.  I reviewed the past medical history, family history, social history, surgical history, and allergies today and no changes were needed.  Please see the problem list section below in epic for further details.  Past Medical History: Past Medical History:  Diagnosis Date  . Allergy   . Asthma   . Lumbar stress fracture    Past Surgical History: Past Surgical History:  Procedure Laterality Date  . NO PAST SURGERIES    . UMBILICAL HERNIA REPAIR     Social History: Social History   Socioeconomic History  . Marital status: Single    Spouse name: Not on file  . Number of children: Not on file  . Years of education: Not on file  . Highest education level: Not on file  Occupational History  . Occupation: Ship broker  Social Needs  . Financial resource strain: Not on file  . Food insecurity:    Worry: Not on file    Inability: Not on file  . Transportation needs:    Medical: Not on file    Non-medical: Not on file  Tobacco Use  . Smoking status: Never Smoker  . Smokeless tobacco: Never Used  Substance and Sexual Activity  . Alcohol use: No  . Drug use: No  . Sexual activity: Never  Lifestyle  . Physical activity:    Days per week: Not on file    Minutes per session: Not on file  . Stress: Not on file  Relationships  . Social connections:    Talks on phone: Not on file    Gets together: Not on file    Attends religious service: Not on file    Active member of club or organization: Not on file    Attends meetings of clubs or organizations: Not on file    Relationship status: Not on file  Other Topics Concern  . Not on file  Social History Narrative  . Not on  file   Family History: Family History  Problem Relation Age of Onset  . Hypertension Mother    Allergies: Allergies  Allergen Reactions  . Peanut-Containing Drug Products   . Shellfish Allergy    Medications: See med rec.  Review of Systems: No fevers, chills, night sweats, weight loss, chest pain, or shortness of breath.   Objective:    General: Well Developed, well nourished, and in no acute distress.  Neuro: Alert and oriented x3, extra-ocular muscles intact, sensation grossly intact.  HEENT: Normocephalic, atraumatic, pupils equal round reactive to light, neck supple, no masses, no lymphadenopathy, thyroid nonpalpable.  Skin: Warm and dry, no rashes. Cardiac: Regular rate and rhythm, no murmurs rubs or gallops, no lower extremity edema.  Respiratory: Clear to auscultation bilaterally. Not using accessory muscles, speaking in full sentences.  Impression and Recommendations:    Chondromalacia of right patellofemoral joint Essentially resolved after injection and formal PT. He is graduating to a home exercise program, continue these exercises indefinitely. Wear the brace when playing basketball, avoid prolonged and deep flexion. Return to see me as needed.   ___________________________________________ Gwen Her. Dianah Field, M.D., ABFM., CAQSM. Primary Care and Sports Medicine  [facility]  Adjunct Professor of [place]  [employer]

## 2018-09-06 ENCOUNTER — Encounter: Payer: Self-pay | Admitting: Physician Assistant

## 2018-10-12 ENCOUNTER — Other Ambulatory Visit: Payer: Self-pay

## 2018-10-12 ENCOUNTER — Ambulatory Visit (INDEPENDENT_AMBULATORY_CARE_PROVIDER_SITE_OTHER): Payer: PRIVATE HEALTH INSURANCE | Admitting: Physician Assistant

## 2018-10-12 VITALS — Temp 98.3°F

## 2018-10-12 DIAGNOSIS — Z23 Encounter for immunization: Secondary | ICD-10-CM

## 2018-10-12 NOTE — Progress Notes (Signed)
Patient presents to office for second HPV vaccine. Patient denied any reactions from first vaccine.   Patient tolerated injection well without complication in right deltoid. Patient advised to schedule 3rd vaccine in at least 12 weeks.

## 2019-01-04 ENCOUNTER — Other Ambulatory Visit: Payer: Self-pay

## 2019-01-04 ENCOUNTER — Ambulatory Visit (INDEPENDENT_AMBULATORY_CARE_PROVIDER_SITE_OTHER): Payer: PRIVATE HEALTH INSURANCE | Admitting: Osteopathic Medicine

## 2019-01-04 VITALS — BP 124/72 | HR 68 | Temp 97.6°F | Wt 160.0 lb

## 2019-01-04 DIAGNOSIS — Z23 Encounter for immunization: Secondary | ICD-10-CM | POA: Diagnosis not present

## 2019-01-04 NOTE — Progress Notes (Signed)
Pt in office today foe HPV #3 and flu shot. Pt tolerated injections well without reactions.

## 2019-02-10 ENCOUNTER — Ambulatory Visit: Payer: Managed Care, Other (non HMO)

## 2019-08-11 ENCOUNTER — Encounter: Payer: Managed Care, Other (non HMO) | Admitting: Physician Assistant

## 2019-08-11 ENCOUNTER — Encounter: Payer: Self-pay | Admitting: Osteopathic Medicine

## 2019-08-11 ENCOUNTER — Ambulatory Visit (INDEPENDENT_AMBULATORY_CARE_PROVIDER_SITE_OTHER): Payer: PRIVATE HEALTH INSURANCE | Admitting: Osteopathic Medicine

## 2019-08-11 VITALS — BP 145/60 | HR 52 | Temp 98.0°F | Ht 74.0 in | Wt 159.0 lb

## 2019-08-11 DIAGNOSIS — J4530 Mild persistent asthma, uncomplicated: Secondary | ICD-10-CM | POA: Diagnosis not present

## 2019-08-11 DIAGNOSIS — Z91018 Allergy to other foods: Secondary | ICD-10-CM

## 2019-08-11 DIAGNOSIS — Z91013 Allergy to seafood: Secondary | ICD-10-CM

## 2019-08-11 DIAGNOSIS — Z0000 Encounter for general adult medical examination without abnormal findings: Secondary | ICD-10-CM

## 2019-08-11 MED ORDER — EPINEPHRINE 0.3 MG/0.3ML IJ SOAJ: 0.3000 mg | Freq: Once | INTRAMUSCULAR | 99 refills | Status: DC | PRN

## 2019-08-11 MED ORDER — ALBUTEROL SULFATE HFA 108 (90 BASE) MCG/ACT IN AERS: INHALATION_SPRAY | RESPIRATORY_TRACT | 99 refills | Status: DC

## 2019-08-11 NOTE — Patient Instructions (Signed)
General Preventive Care  Most recent routine screening labs: normal last year, can repeat next year.   Everyone should have blood pressure checked once per year. Goal 130/80 or less.   Tobacco: don't!   Alcohol: responsible moderation is ok for most adults - if you have concerns about your alcohol intake, please talk to me!   Exercise: as tolerated to reduce risk of cardiovascular disease and diabetes. Strength training will also prevent osteoporosis.   Mental health: if need for mental health care (medicines, counseling, other), or concerns about moods, please let me know!   Sexual health: if need for STD testing, or if concerns with libido/pain problems, please let me know!  Advanced Directive: Living Will and/or Healthcare Power of Attorney recommended for all adults, regardless of age or health.  Vaccines  Flu vaccine: for almost everyone, every fall.   Shingles vaccine: after age 79.   Pneumonia vaccines: after age 57  Tetanus booster: every 10 years. Due 2023.   HPV vaccine: all done!  COVID vaccine: THANKS for taking the vaccine! :) Cancer screenings   Colon cancer screening: for everyone age 80-75.   Prostate cancer screening: PSA blood test age 10-71  Lung cancer screening:  Not needed for non-smokers. Infection screenings  . HIV: recommended screening at least once age 95-65, more often as needed. . Gonorrhea/Chlamydia: screening as needed . Hepatitis C: recommended once for everyone age 11-75 . TB: certain at-risk populations, or depending on work requirements and/or travel history Other . Bone Density Test: recommended for men at age 54, sooner depending on risk factors . Abdominal Aortic Aneurysm: screening with ultrasound recommended once for men age 24-75 who have ever smoked

## 2019-08-11 NOTE — Progress Notes (Signed)
KENO CARAWAY II is a 24 y.o. male who presents to  [place] at [facility]  today, 08/11/19, seeking care for the following:  Annual physical      ASSESSMENT & PLAN with other pertinent history/findings:  The primary encounter diagnosis was Annual physical exam. Diagnoses of Mild persistent asthma without complication, Shellfish allergy, and Tree nut allergy were also pertinent to this visit.  Doing well Occasional R knee pain, previously seen by Dr T, given instructions for patellofemoral pain and recommended compression sleeve, f/u sports med prn   Physical exam:  Constitutional:   VSS, see nurse notes  General Appearance: alert, well-developed, well-nourished, NAD Eyes:  Normal lids and conjunctive, non-icteric sclera Neck:  No masses, trachea midline  No thyroid enlargement/tenderness/mass appreciated Respiratory:  Normal respiratory effort  Breath sounds normal, no wheeze/rhonchi/rales Cardiovascular:  S1/S2 normal, no murmur/rub/gallop auscultated  No lower extremity edema Gastrointestinal:  Nontender, no masses  No hepatomegaly, no splenomegaly  No hernia appreciated Musculoskeletal:   Gait normal  No clubbing/cyanosis of digits Neurological:  No cranial nerve deficit on limited exam  Motor and sensation intact and symmetric Psychiatric:  Normal judgment/insight  Normal mood and affect   No orders of the defined types were placed in this encounter.   Meds ordered this encounter  Medications   albuterol (VENTOLIN HFA) 108 (90 Base) MCG/ACT inhaler    Sig: Inhale 2 puffs, 15 minutes prior to exertion/exercise. Inhale 1-2 puffs Q4H prn for wheezing/SOB    Dispense:  18 g    Refill:  99   EPINEPHrine 0.3 mg/0.3 mL IJ SOAJ injection    Sig: Inject 0.3 mLs (0.3 mg total) into the muscle once as needed for up to 1 dose (anaphylactic reaction).    Dispense:  2 each    Refill:  99     Patient Instructions  General Preventive Care  Most recent routine screening labs: normal last year, can repeat next year.   Everyone should have blood pressure checked once per year. Goal 130/80 or less.   Tobacco: don't!   Alcohol: responsible moderation is ok for most adults - if you have concerns about your alcohol intake, please talk to me!   Exercise: as tolerated to reduce risk of cardiovascular disease and diabetes. Strength training will also prevent osteoporosis.   Mental health: if need for mental health care (medicines, counseling, other), or concerns about moods, please let me know!   Sexual health: if need for STD testing, or if concerns with libido/pain problems, please let me know!  Advanced Directive: Living Will and/or Healthcare Power of Attorney recommended for all adults, regardless of age or health.  Vaccines  Flu vaccine: for almost everyone, every fall.   Shingles vaccine: after age 53.   Pneumonia vaccines: after age 76  Tetanus booster: every 10 years. Due 2023.   HPV vaccine: all done!  COVID vaccine: THANKS for taking the vaccine! :) Cancer screenings   Colon cancer screening: for everyone age 62-75.   Prostate cancer screening: PSA blood test age 41-71  Lung cancer screening:  Not needed for non-smokers. Infection screenings   HIV: recommended screening at least once age 24-65, more often as needed.  Gonorrhea/Chlamydia: screening as needed  Hepatitis C: recommended once for everyone age 05-39  TB: certain at-risk populations, or depending on work requirements and/or travel history Other  Bone Density Test: recommended for men at age 88, sooner depending on risk factors  Abdominal Aortic Aneurysm:  screening with ultrasound recommended once for men age 68-75 who have ever smoked      Follow-up instructions: Return in about 1 year (around 08/10/2020) for [place] (call week prior to visit for lab  orders).                                         BP (!) 145/60 (BP Location: Left Arm, Patient Position: Sitting)    Pulse (!) 52    Temp 98 F (36.7 C)    Ht 6\' 2"  (1.88 m)    Wt 159 lb (72.1 kg)    SpO2 98%    BMI 20.41 kg/m   Current Meds  Medication Sig   albuterol (VENTOLIN HFA) 108 (90 Base) MCG/ACT inhaler Inhale 2 puffs, 15 minutes prior to exertion/exercise. Inhale 1-2 puffs Q4H prn for wheezing/SOB   EPINEPHrine 0.3 mg/0.3 mL IJ SOAJ injection Inject 0.3 mLs (0.3 mg total) into the muscle once as needed for up to 1 dose (anaphylactic reaction).    No results found for this or any previous visit (from the past 72 hour(s)).  No results found.  Depression screen [facility] 2/9 08/11/2019 08/11/2018 01/14/2018  Decreased Interest 0 0 0  Down, Depressed, Hopeless 0 0 0  PHQ - 2 Score 0 0 0  Altered sleeping 0 - -  Change in appetite 0 - -  Feeling bad or failure about yourself  0 - -  Trouble concentrating 0 - -  Moving slowly or fidgety/restless 0 - -  Suicidal thoughts 0 - -  PHQ-9 Score 0 - -    GAD 7 : Generalized Anxiety Score 08/11/2019  Nervous, Anxious, on Edge 0  Control/stop worrying 0  Worry too much - different things 0  Trouble relaxing 0  Restless 0  Easily annoyed or irritable 0  Afraid - awful might happen 0  Total GAD 7 Score 0      All questions at time of visit were answered - patient instructed to contact office with any additional concerns or updates.  ER/RTC precautions were reviewed with the patient.  Please note: voice recognition software was used to produce this document, and typos may escape review. Please contact Dr. 10/11/2019 for any needed clarifications.

## 2020-03-04 IMAGING — DX RIGHT KNEE - COMPLETE 4+ VIEW
3 series · 3 of 3 positions shown · non-contrast
Comparison: 03/23/2012

CLINICAL DATA: 22-year-old male with a history of painful right
knee

EXAM:
RIGHT KNEE - COMPLETE 4+ VIEW

[tunnel]
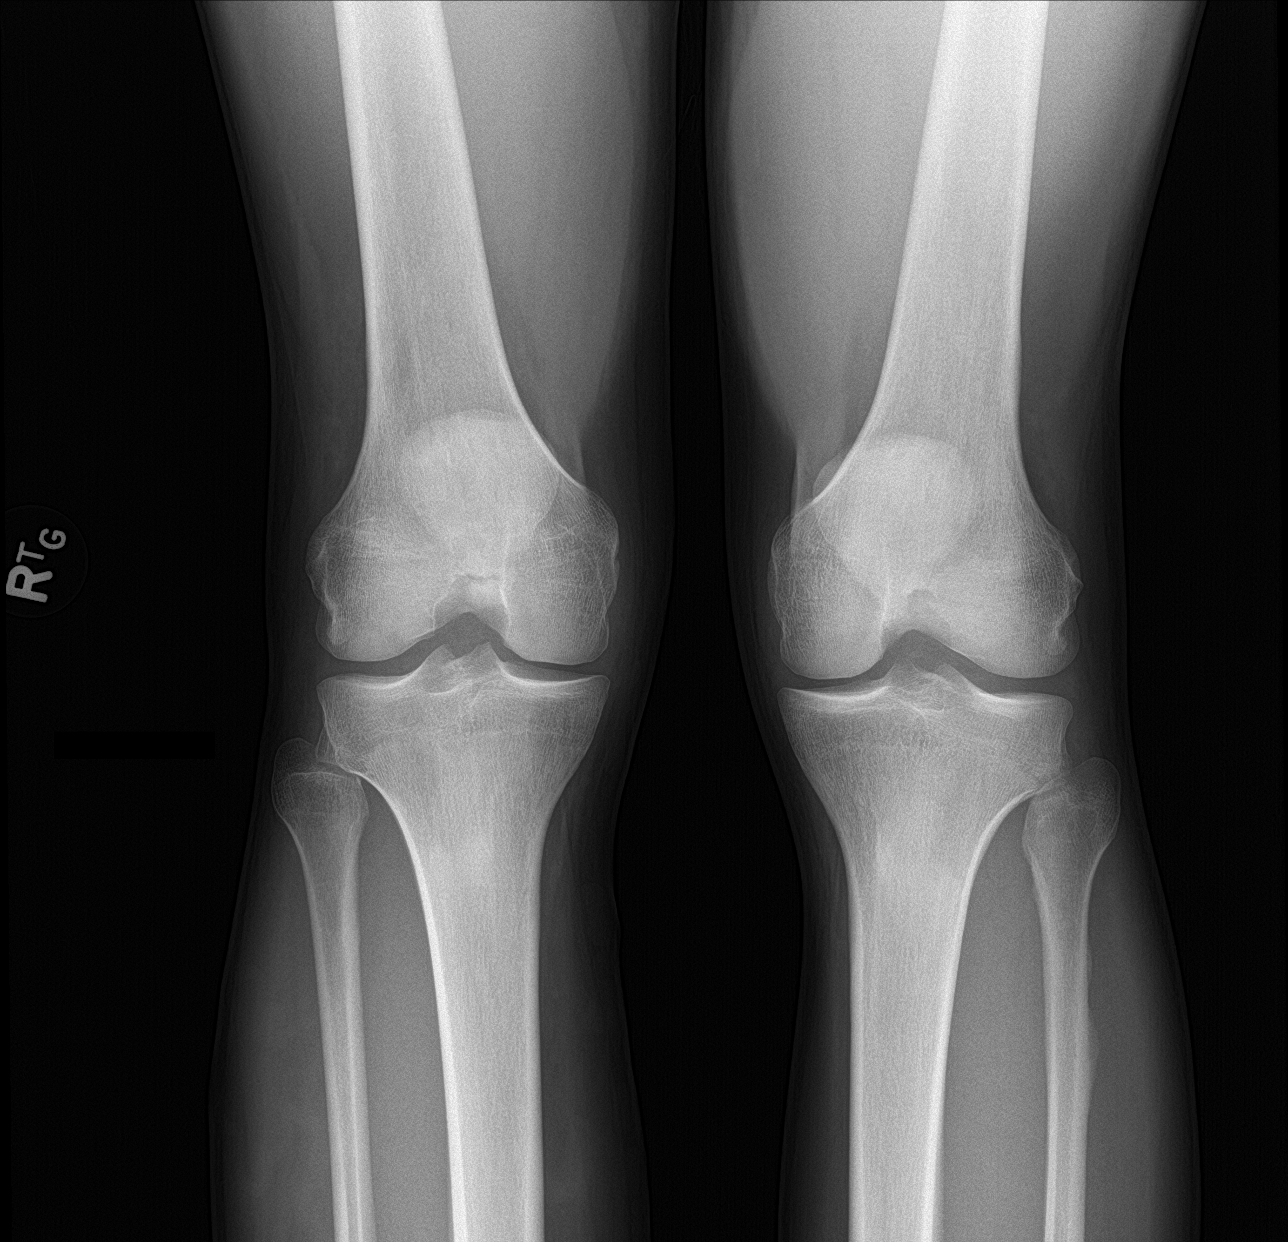

[knee lat]
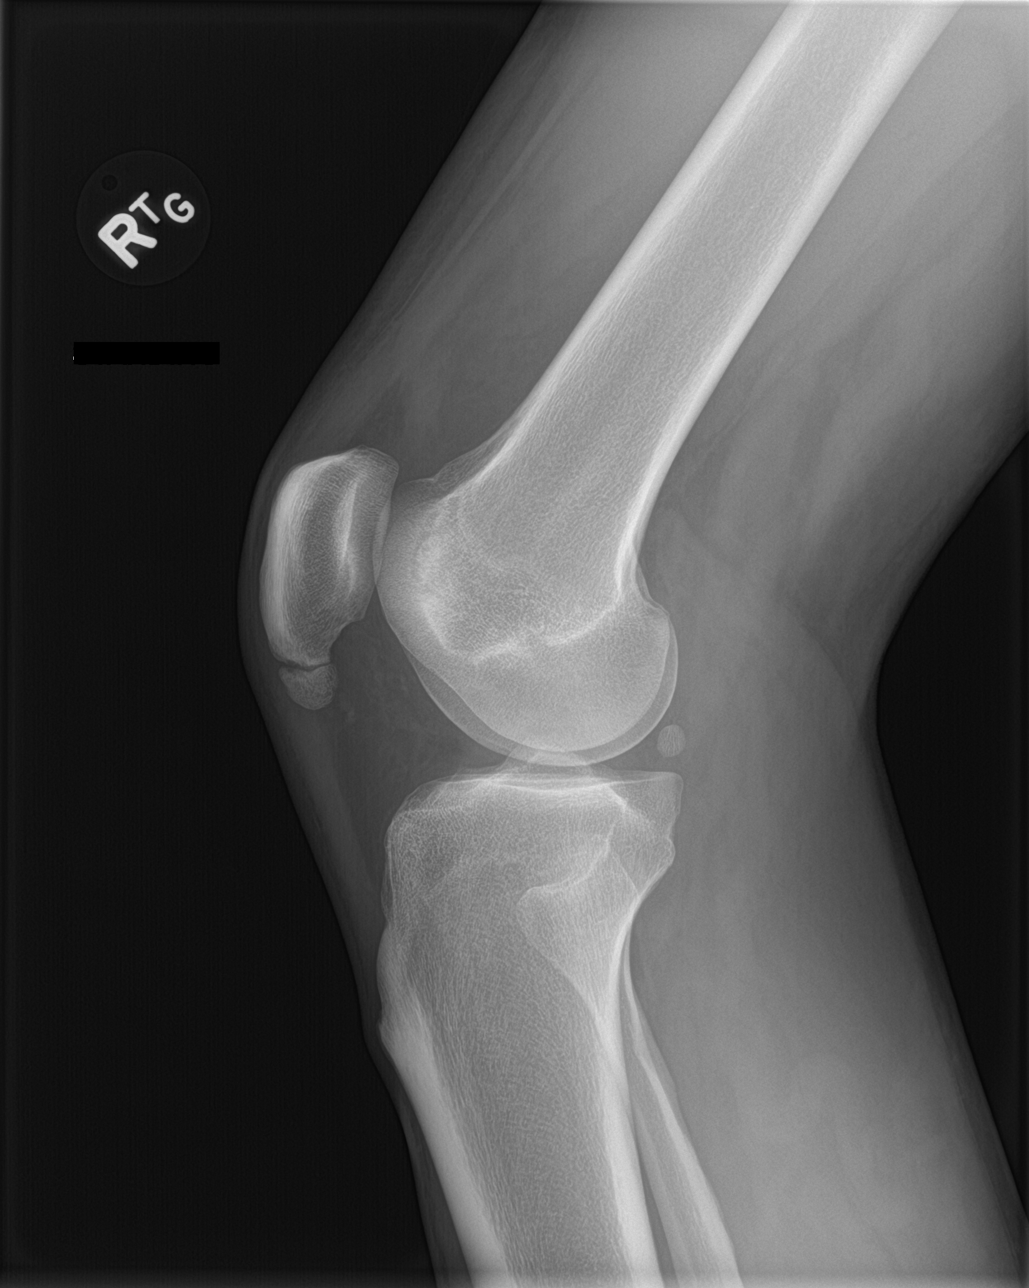

[knee sunrise]
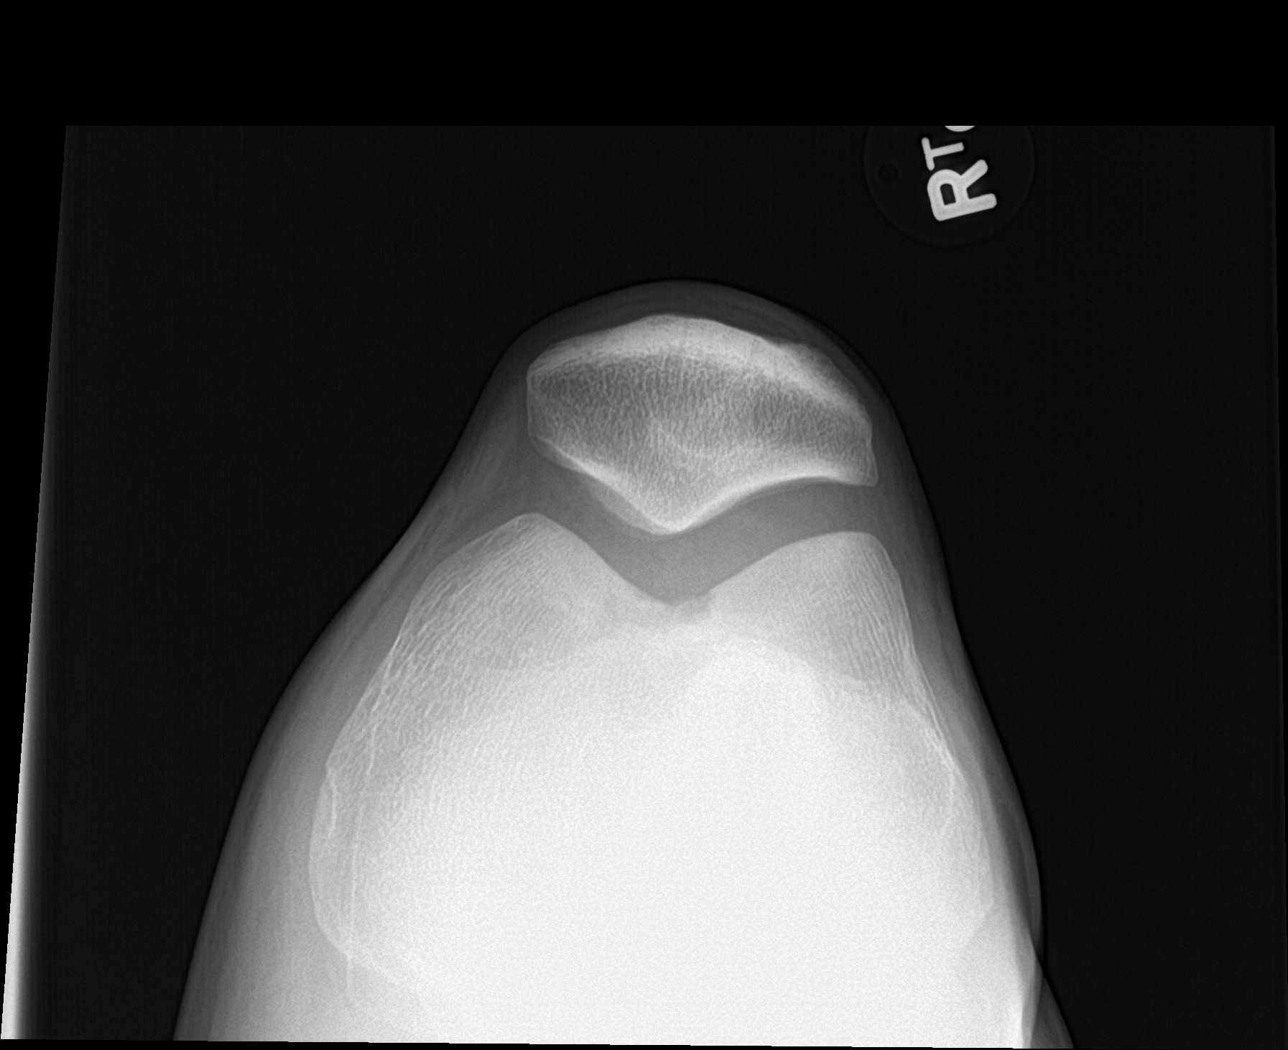

[3 of 3 positions shown; findings below may reference images not displayed]

FINDINGS: Right:

No acute displaced fracture. Changes of apophysitis involving the
inferior patella at the proximal patellar tendon insertion. No joint
effusion. Unremarkable sunrise view.

Unremarkable appearance of the left knee on the frontal view.
IMPRESSION: Negative for acute bony abnormality.

Sequela of patellar apophysitis ("jumper's knee").

## 2020-08-14 ENCOUNTER — Encounter: Payer: Self-pay | Admitting: Osteopathic Medicine

## 2020-08-14 ENCOUNTER — Ambulatory Visit (INDEPENDENT_AMBULATORY_CARE_PROVIDER_SITE_OTHER): Payer: PRIVATE HEALTH INSURANCE | Admitting: Osteopathic Medicine

## 2020-08-14 ENCOUNTER — Other Ambulatory Visit: Payer: Self-pay

## 2020-08-14 VITALS — BP 129/80 | HR 55 | Temp 98.5°F | Ht 75.0 in | Wt 152.1 lb

## 2020-08-14 DIAGNOSIS — Z0000 Encounter for general adult medical examination without abnormal findings: Secondary | ICD-10-CM | POA: Diagnosis not present

## 2020-08-14 DIAGNOSIS — J4530 Mild persistent asthma, uncomplicated: Secondary | ICD-10-CM | POA: Diagnosis not present

## 2020-08-14 DIAGNOSIS — Z91018 Allergy to other foods: Secondary | ICD-10-CM

## 2020-08-14 DIAGNOSIS — Z91013 Allergy to seafood: Secondary | ICD-10-CM

## 2020-08-14 LAB — COMPLETE METABOLIC PANEL WITH GFR
AG Ratio: 1.6 (calc) (ref 1.0–2.5)
ALT: 18 U/L (ref 9–46)
AST: 15 U/L (ref 10–40)
Albumin: 4.5 g/dL (ref 3.6–5.1)
Alkaline phosphatase (APISO): 62 U/L (ref 36–130)
BUN: 10 mg/dL (ref 7–25)
CO2: 28 mmol/L (ref 20–32)
Calcium: 9.7 mg/dL (ref 8.6–10.3)
Chloride: 105 mmol/L (ref 98–110)
Creat: 1.13 mg/dL (ref 0.60–1.35)
GFR, Est African American: 105 mL/min/{1.73_m2} (ref >=60)
GFR, Est Non African American: 90 mL/min/{1.73_m2} (ref >=60)
Globulin: 2.8 g/dL (calc) (ref 1.9–3.7)
Glucose, Bld: 86 mg/dL (ref 65–99)
Potassium: 4.5 mmol/L (ref 3.5–5.3)
Sodium: 138 mmol/L (ref 135–146)
Total Bilirubin: 0.7 mg/dL (ref 0.2–1.2)
Total Protein: 7.3 g/dL (ref 6.1–8.1)

## 2020-08-14 LAB — LIPID PANEL
Cholesterol: 143 mg/dL (ref <200)
HDL: 63 mg/dL (ref >=40)
LDL Cholesterol (Calc): 67 mg/dL (calc)
Non-HDL Cholesterol (Calc): 80 mg/dL (calc) (ref <130)
Total CHOL/HDL Ratio: 2.3 (calc) (ref <5.0)
Triglycerides: 54 mg/dL (ref <150)

## 2020-08-14 LAB — CBC
HCT: 46.6 % (ref 38.5–50.0)
Hemoglobin: 15.2 g/dL (ref 13.2–17.1)
MCH: 28.9 pg (ref 27.0–33.0)
MCHC: 32.6 g/dL (ref 32.0–36.0)
MCV: 88.6 fL (ref 80.0–100.0)
MPV: 10.2 fL (ref 7.5–12.5)
Platelets: 249 10*3/uL (ref 140–400)
RBC: 5.26 10*6/uL (ref 4.20–5.80)
RDW: 11.6 % (ref 11.0–15.0)
WBC: 3.7 10*3/uL — ABNORMAL LOW (ref 3.8–10.8)

## 2020-08-14 MED ORDER — CLOTRIMAZOLE-BETAMETHASONE 1-0.05 % EX CREA: 1.0000 "application " | TOPICAL_CREAM | Freq: Two times a day (BID) | CUTANEOUS | 0 refills | Status: DC

## 2020-08-14 MED ORDER — EPINEPHRINE 0.3 MG/0.3ML IJ SOAJ: 0.3000 mg | Freq: Once | INTRAMUSCULAR | 99 refills | Status: DC | PRN

## 2020-08-14 MED ORDER — ALBUTEROL SULFATE HFA 108 (90 BASE) MCG/ACT IN AERS: INHALATION_SPRAY | RESPIRATORY_TRACT | 99 refills | Status: DC

## 2020-08-14 NOTE — Progress Notes (Signed)
Jon Salazar is a 25 y.o. male who presents to  [place] at [facility]  today, 08/14/20, seeking care for the following:  Annual physical  Rash on leg - hyperpigmented macular rash, nonblanching, pt reports occasinally itching      ASSESSMENT & PLAN with other pertinent findings:  The primary encounter diagnosis was Annual physical exam. Diagnoses of Mild persistent asthma without complication, Shellfish allergy, and Tree nut allergy were also pertinent to this visit.   Suspect tinea as likely cause of rash of concern.  Sent antifungal as below, reminder set to touch base with him in 2 weeks to see if this is helping, if it is can continue treatment, if not would have him back for punch biopsy to confirm diagnosis.  No red flags for malignancy or inflammatory condition.  Patient Instructions  General Preventive Care Most recent routine screening labs: ordered today.  Blood pressure goal 130/80 or less.  Tobacco: don't!  Alcohol: responsible moderation is ok for most adults - if you have concerns about your alcohol intake, please talk to me!  Exercise: as tolerated to reduce risk of cardiovascular disease and diabetes. Strength training will also prevent osteoporosis.  Mental health: if need for mental health care (medicines, counseling, other), or concerns about moods, please let me know!  Sexual / Reproductive health: if need for STD testing, or if concerns with libido/pain problems, please let me know! If you need to discuss family planning, please let me know!  Advanced Directive: Living Will and/or Healthcare Power of Attorney recommended for all adults, regardless of age or health.  Vaccines Flu vaccine: for almost everyone, every fall.  Shingles vaccine: after age 27.  Pneumonia vaccines: after age 34. Tetanus booster: every 10 years, due 2023! HPV vaccine: all done!   COVID vaccine: BOOSTER STRONGLY RECOMMENDED  Cancer  screenings  Colon cancer screening: for everyone age 30-75.  Prostate cancer screening: PSA blood test age 29-71 Lung cancer screening: CT chest every year for those aged 20 to 45 years who have a 20 pack-year smoking history and currently smoke or have quit within the past 15 years  Infection screenings  HIV: recommended screening at least once age 64-65, more often as needed. Gonorrhea/Chlamydia: screening as needed. Hepatitis C: recommended once for everyone age 89-38 TB: certain at-risk populations, or depending on work requirements and/or travel history Other Bone Density Test: recommended for women at age 57, men at age 48, sooner depending on risk factors Abdominal Aortic Aneurysm: screening with ultrasound recommended once for men age 87-75 who have ever smoked   Orders Placed This Encounter  Procedures   CBC   COMPLETE METABOLIC PANEL WITH GFR   Lipid panel    Meds ordered this encounter  Medications   clotrimazole-betamethasone (LOTRISONE) cream    Sig: Apply 1 application topically 2 (two) times daily.    Dispense:  45 g    Refill:  0   albuterol (VENTOLIN HFA) 108 (90 Base) MCG/ACT inhaler    Sig: Inhale 2 puffs, 15 minutes prior to exertion/exercise. Inhale 1-2 puffs Q4H prn for wheezing/SOB    Dispense:  18 g    Refill:  99   EPINEPHrine 0.3 mg/0.3 mL IJ SOAJ injection    Sig: Inject 0.3 mg into the muscle once as needed for up to 1 dose (anaphylactic reaction).    Dispense:  2 each    Refill:  99     See below for relevant  physical exam findings  See below for recent lab and imaging results reviewed  Medications, allergies, PMH, PSH, SocH, [place] reviewed below    Follow-up instructions: Return in about 1 year (around 08/14/2021) for [place] (call week prior to visit for lab orders).                                        Exam:  BP 129/80 (BP Location: Left Arm, Patient Position: Sitting, Cuff Size: Normal)   Pulse (!) 55    Temp 98.5 F (36.9 C) (Oral)   Ht _0  (1.905 m)   Wt 152 lb 1.3 oz (69 kg)   BMI 19.01 kg/m  Constitutional: VS see above. General Appearance: alert, well-developed, well-nourished, NAD Neck: No masses, trachea midline.  Respiratory: Normal respiratory effort. no wheeze, no rhonchi, no rales Cardiovascular: S1/S2 normal, no murmur, no rub/gallop auscultated. RRR.  Musculoskeletal: Gait normal. Symmetric and independent movement of all extremities Abdominal: non-tender, non-distended, no appreciable organomegaly, neg Murphy's, BS WNLx4 Neurological: Normal balance/coordination. No tremor. Skin: warm, dry, intact. See above Psychiatric: Normal judgment/insight. Normal mood and affect. Oriented x3.   Current Meds  Medication Sig   clotrimazole-betamethasone (LOTRISONE) cream Apply 1 application topically 2 (two) times daily.   [DISCONTINUED] albuterol (VENTOLIN HFA) 108 (90 Base) MCG/ACT inhaler Inhale 2 puffs, 15 minutes prior to exertion/exercise. Inhale 1-2 puffs Q4H prn for wheezing/SOB   [DISCONTINUED] EPINEPHrine 0.3 mg/0.3 mL IJ SOAJ injection Inject 0.3 mLs (0.3 mg total) into the muscle once as needed for up to 1 dose (anaphylactic reaction).    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   ASTHMA 03/05/2010   DERMATITIS, PERIORAL 03/05/2010   ACNE VULGARIS, 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    Family History  Problem Relation Age of Onset   Hypertension Mother     Social History    Tobacco Use  Smoking Status Never  Smokeless Tobacco Never    Past Surgical History:  Procedure Laterality Date   NO PAST SURGERIES     UMBILICAL HERNIA REPAIR      Immunization History  Administered Date(s) Administered   DTaP 10/20/1996, 12/08/1996, 02/08/1997, 02/07/1998, 09/17/2001   HPV 9-valent 08/11/2018, 10/12/2018, 01/04/2019   Hepatitis A 04/03/2007   Hepatitis A, Ped/Adol-2 Dose 09/28/2013   Hepatitis B Jul [birth year], 1996, 09/13/1996, 05/11/1997   HiB (PRP-OMP) 10/20/1996, 12/08/1996, 02/08/1997, 02/07/1998   IPV 10/20/1996, 12/08/1996, 08/09/1997, 08/18/2001   Influenza Whole 03/26/2000, 12/18/2000, 12/03/2002   Influenza,inj,Quad PF,6+ Mos 12/14/2012, 12/16/2013, 01/14/2018, 01/04/2019   Janssen (J&J) SARS-COV-2 Vaccination 06/09/2019   MMR 08/09/1997, 08/18/2001   Meningococcal Conjugate 10/25/2011, 09/28/2013   Moderna Sars-Covid-2 Vaccination 02/28/2020   Pneumococcal Conjugate-13 09/15/2000   Pneumococcal Polysaccharide-23 01/14/2018   Tdap 10/25/2011   Varicella 08/09/1997, 04/03/2007    Recent Results (from the past 2160 hour(s))  CBC     Status: Abnormal   Collection Time: 08/14/20 12:00 AM  Result  Value Ref Range   WBC 3.7 (L) 3.8 - 10.8 Thousand/uL   RBC 5.26 4.20 - 5.80 Million/uL   Hemoglobin 15.2 13.2 - 17.1 g/dL   HCT 46.6 38.5 - 50.0 %   MCV 88.6 80.0 - 100.0 fL   MCH 28.9 27.0 - 33.0 pg   MCHC 32.6 32.0 - 36.0 g/dL   RDW 11.6 11.0 - 15.0 %   Platelets 249 140 - 400 Thousand/uL   MPV 10.2 7.5 - 12.5 fL    No results found.     All questions at time of visit were answered - patient instructed to contact office with any additional concerns or updates. ER/RTC precautions were reviewed with the patient as applicable.   Please note: manual typing as well as voice recognition software may have been used to produce this document - typos may escape review. Please contact Dr. Sheppard Coil for any needed clarifications.

## 2020-08-14 NOTE — Patient Instructions (Signed)
General Preventive Care Most recent routine screening labs: ordered today.  Blood pressure goal 130/80 or less.  Tobacco: don't!  Alcohol: responsible moderation is ok for most adults - if you have concerns about your alcohol intake, please talk to me!  Exercise: as tolerated to reduce risk of cardiovascular disease and diabetes. Strength training will also prevent osteoporosis.  Mental health: if need for mental health care (medicines, counseling, other), or concerns about moods, please let me know!  Sexual / Reproductive health: if need for STD testing, or if concerns with libido/pain problems, please let me know! If you need to discuss family planning, please let me know!  Advanced Directive: Living Will and/or Healthcare Power of Attorney recommended for all adults, regardless of age or health.  Vaccines Flu vaccine: for almost everyone, every fall.  Shingles vaccine: after age 50.  Pneumonia vaccines: after age 28. Tetanus booster: every 10 years, due 2023! HPV vaccine: all done!   COVID vaccine: BOOSTER STRONGLY RECOMMENDED  Cancer screenings  Colon cancer screening: for everyone age 40-75.  Prostate cancer screening: PSA blood test age 22-71 Lung cancer screening: CT chest every year for those aged 46 to 61 years who have a 20 pack-year smoking history and currently smoke or have quit within the past 15 years  Infection screenings  HIV: recommended screening at least once age 30-65, more often as needed. Gonorrhea/Chlamydia: screening as needed. Hepatitis C: recommended once for everyone age 45-75 TB: certain at-risk populations, or depending on work requirements and/or travel history Other Bone Density Test: recommended for women at age 56, men at age 42, sooner depending on risk factors Abdominal Aortic Aneurysm: screening with ultrasound recommended once for men age 5-75 who have ever smoked

## 2020-09-01 ENCOUNTER — Telehealth: Payer: Self-pay | Admitting: Osteopathic Medicine

## 2020-09-01 NOTE — Telephone Encounter (Signed)
-----   Message from Sunnie Nielsen, DO sent at 08/14/2020  5:16 PM EDT ----- We started topical therapy 08/14/2020 for rash on back of leg presumably fungal infection, is this looking any better?  If yes, nothing to do, continue treatment.  If not, RTC for punch biopsy

## 2020-09-19 NOTE — Telephone Encounter (Signed)
Left a message for a return call.

## 2020-09-25 NOTE — Telephone Encounter (Signed)
Pt states that the rash is looking a little better in his opinion.  Advised pt to continue current treatment, but that if he feels it is not continuing to improve to call the office to schedule an appt for punch biopsy.  Pt expressed understanding and is agreeable.  Tiajuana Amass, CMA

## 2022-09-27 ENCOUNTER — Encounter: Payer: Self-pay | Admitting: Family Medicine

## 2022-09-27 ENCOUNTER — Ambulatory Visit (INDEPENDENT_AMBULATORY_CARE_PROVIDER_SITE_OTHER): Payer: BC Managed Care – PPO | Admitting: Family Medicine

## 2022-09-27 VITALS — BP 149/88 | HR 85 | Resp 20 | Ht 75.0 in | Wt 179.2 lb

## 2022-09-27 DIAGNOSIS — Z0000 Encounter for general adult medical examination without abnormal findings: Secondary | ICD-10-CM | POA: Insufficient documentation

## 2022-09-27 DIAGNOSIS — Z91013 Allergy to seafood: Secondary | ICD-10-CM

## 2022-09-27 DIAGNOSIS — Z114 Encounter for screening for human immunodeficiency virus [HIV]: Secondary | ICD-10-CM

## 2022-09-27 DIAGNOSIS — Z1159 Encounter for screening for other viral diseases: Secondary | ICD-10-CM | POA: Diagnosis not present

## 2022-09-27 DIAGNOSIS — Z91018 Allergy to other foods: Secondary | ICD-10-CM

## 2022-09-27 DIAGNOSIS — Z23 Encounter for immunization: Secondary | ICD-10-CM | POA: Diagnosis not present

## 2022-09-27 MED ORDER — EPINEPHRINE 0.3 MG/0.3ML IJ SOAJ: 0.3000 mg | INTRAMUSCULAR | 1 refills | Status: DC | PRN

## 2022-09-27 NOTE — Progress Notes (Signed)
Established patient visit   Patient: Jon Salazar   DOB: 1995-06-23   27 y.o. Male  MRN: [id number] Visit Date: 09/27/2022  Today's healthcare provider: Charlton Amor, DO   Chief Complaint  Patient presents with   New Patient (Initial Visit)    Establish care pt would also like a physical    SUBJECTIVE    Chief Complaint  Patient presents with   New Patient (Initial Visit)    Establish care pt would also like a physical   HPI HPI     New Patient (Initial Visit)    Additional comments: Establish care pt would also like a physical      Last edited by Roselyn Reef, CMA on 09/27/2022  8:34 AM.       Diet:  varied  Exercise: walk 30 min every other day; used to play basketball   Family hx: HTN: mom  DM: grandparents Cancer: no  Social hx: Alcohol use: no  Tobacco (chew, smoke): no  Sexually active: no   Who do you live with: mom and dad  House  Safe at home: no    Review of Systems  Constitutional:  Negative for activity change, fatigue and fever.  Respiratory:  Negative for cough and shortness of breath.   Cardiovascular:  Negative for chest pain.  Gastrointestinal:  Negative for abdominal pain.  Genitourinary:  Negative for difficulty urinating.       Current Meds  Medication Sig   EPINEPHrine 0.3 mg/0.3 mL IJ SOAJ injection Inject 0.3 mg into the muscle as needed for anaphylaxis.    OBJECTIVE    BP (!) 149/88 (BP Location: Right Arm, Patient Position: Sitting, Cuff Size: Normal)   Pulse 85   Resp 20   Ht 6\' 3"  (1.905 m)   Wt 179 lb 4 oz (81.3 kg)   SpO2 98%   BMI 22.40 kg/m   Physical Exam Vitals and nursing note reviewed.  Constitutional:      General: He is not in acute distress.    Appearance: Normal appearance.  HENT:     Head: Normocephalic and atraumatic.     Right Ear: External ear normal.     Left Ear: External ear normal.     Nose: Nose normal.  Eyes:     Conjunctiva/sclera: Conjunctivae normal.   Cardiovascular:     Rate and Rhythm: Normal rate and regular rhythm.  Pulmonary:     Effort: Pulmonary effort is normal.     Breath sounds: Normal breath sounds.  Abdominal:     General: Abdomen is flat. Bowel sounds are normal.     Palpations: Abdomen is soft.  Neurological:     General: No focal deficit present.     Mental Status: He is alert and oriented to person, place, and time.  Psychiatric:        Mood and Affect: Mood normal.        Behavior: Behavior normal.        Thought Content: Thought content normal.        Judgment: Judgment normal.        ASSESSMENT & PLAN    Problem List Items Addressed This Visit       Other   Shellfish allergy    - counseled on epi pen use and reminded pt if epi pen is used he is to go to the ED      Relevant Medications   EPINEPHrine 0.3 mg/0.3 mL IJ SOAJ injection  Tree nut allergy   Relevant Medications   EPINEPHrine 0.3 mg/0.3 mL IJ SOAJ injection   Routine adult health maintenance - Primary    Pt doing well, will get blood work      Relevant Orders   Lipid panel   CBC   Basic Metabolic Panel (BMET)   Other Visit Diagnoses     Encounter for hepatitis C screening test for low risk patient       Relevant Orders   Hepatitis C antibody   Encounter for screening for human immunodeficiency virus (HIV)       Relevant Orders   HIV antibody (with reflex)       Return in about 1 year (around 09/27/2023) for [place].      Meds ordered this encounter  Medications   EPINEPHrine 0.3 mg/0.3 mL IJ SOAJ injection    Sig: Inject 0.3 mg into the muscle as needed for anaphylaxis.    Dispense:  2 each    Refill:  1    Orders Placed This Encounter  Procedures   Tdap vaccine greater than or equal to 7yo IM   Lipid panel    Order Specific Question:   Has the patient fasted?    Answer:   No    Order Specific Question:   Release to patient    Answer:   Immediate   CBC   Basic Metabolic Panel (BMET)    Order Specific  Question:   Has the patient fasted?    Answer:   No   HIV antibody (with reflex)   Hepatitis C antibody     Charlton Amor, DO  [facility] at [facility] [phone] (phone) [phone] (fax)  [facility]

## 2022-09-27 NOTE — Assessment & Plan Note (Signed)
-   counseled on epi pen use and reminded pt if epi pen is used he is to go to the ED

## 2022-09-27 NOTE — Assessment & Plan Note (Signed)
Pt doing well, will get blood work

## 2022-09-28 LAB — CBC
Hematocrit: 46.8 % (ref 37.5–51.0)
Hemoglobin: 14.9 g/dL (ref 13.0–17.7)
MCH: 28.7 pg (ref 26.6–33.0)
MCHC: 31.8 g/dL (ref 31.5–35.7)
MCV: 90 fL (ref 79–97)
Platelets: 231 x10E3/uL (ref 150–450)
RBC: 5.2 x10E6/uL (ref 4.14–5.80)
RDW: 11.7 % (ref 11.6–15.4)
WBC: 4.6 x10E3/uL (ref 3.4–10.8)

## 2023-07-10 ENCOUNTER — Encounter: Payer: Self-pay | Admitting: Family Medicine

## 2023-09-29 ENCOUNTER — Encounter: Payer: BC Managed Care – PPO | Admitting: Family Medicine

## 2023-10-16 ENCOUNTER — Telehealth: Payer: Self-pay

## 2023-10-16 NOTE — Telephone Encounter (Signed)
 Called Copied from CRM [id number]. Topic: Appointments - Transfer of Care >> Oct 16, 2023  9:26 AM Alfonso ORN wrote: Pt is requesting to transfer FROM: [facility]  Pt is requesting to transfer TO: [facility]  Reason for requested transfer: provider Bevin Asal S,Do no longer at practice  It is the responsibility of the team the patient would like to transfer to (Dr. Benton Gave) to reach out to the patient if for any reason this transfer is not acceptable. Pt. Call back # [phone]

## 2023-10-16 NOTE — Telephone Encounter (Signed)
 I'm confused... I thought I was taking over all her patients? Lol. No need to ask, go ahead and schedule. Thanks! :)

## 2023-10-16 NOTE — Telephone Encounter (Signed)
 Called patient LVM to call office to schedule appt with [facility]    Copied from CRM [id number]. Topic: Appointments - Transfer of Care >> Oct 16, 2023  9:26 AM Alfonso ORN wrote: Pt is requesting to transfer FROM: [facility]  Pt is requesting to transfer TO: [facility]  Reason for requested transfer: provider Bevin Asal S,Do no longer at practice  It is the responsibility of the team the patient would like to transfer to (Dr. Benton Gave) to reach out to the patient if for any reason this transfer is not acceptable. Pt. Call back # [phone]

## 2023-10-20 ENCOUNTER — Ambulatory Visit (INDEPENDENT_AMBULATORY_CARE_PROVIDER_SITE_OTHER): Admitting: Urgent Care

## 2023-10-20 ENCOUNTER — Encounter: Payer: Self-pay | Admitting: Urgent Care

## 2023-10-20 VITALS — BP 135/79 | HR 75 | Resp 17 | Ht 75.0 in | Wt 177.5 lb

## 2023-10-20 DIAGNOSIS — Z0000 Encounter for general adult medical examination without abnormal findings: Secondary | ICD-10-CM

## 2023-10-20 DIAGNOSIS — R7989 Other specified abnormal findings of blood chemistry: Secondary | ICD-10-CM | POA: Diagnosis not present

## 2023-10-20 DIAGNOSIS — Z91018 Allergy to other foods: Secondary | ICD-10-CM

## 2023-10-20 DIAGNOSIS — M2141 Flat foot [pes planus] (acquired), right foot: Secondary | ICD-10-CM

## 2023-10-20 DIAGNOSIS — M2142 Flat foot [pes planus] (acquired), left foot: Secondary | ICD-10-CM

## 2023-10-20 DIAGNOSIS — Z91013 Allergy to seafood: Secondary | ICD-10-CM | POA: Diagnosis not present

## 2023-10-20 LAB — POCT URINALYSIS DIP (CLINITEK)
Bilirubin, UA: NEGATIVE
Blood, UA: NEGATIVE
Glucose, UA: NEGATIVE mg/dL
Ketones, POC UA: NEGATIVE mg/dL
Leukocytes, UA: NEGATIVE
Nitrite, UA: NEGATIVE
POC PROTEIN,UA: NEGATIVE
Spec Grav, UA: <=1.005 — AB (ref 1.010–1.025)
Urobilinogen, UA: 0.2 U/dL
pH, UA: 5.5 (ref 5.0–8.0)

## 2023-10-20 MED ORDER — EPINEPHRINE 0.3 MG/0.3ML IJ SOAJ: 0.3000 mg | INTRAMUSCULAR | 1 refills | Status: AC | PRN

## 2023-10-20 NOTE — Patient Instructions (Signed)
 We completed your annual physical and labs today.  If your foot/ heal pain continues, please consider use of a metatarsal pad in your shoes to help with your pes planus.  Return annually, sooner as needed.

## 2023-10-20 NOTE — Progress Notes (Signed)
 Complete physical exam  Patient: Jon Salazar   DOB: 03/04/1995   28 y.o. Male  MRN: [id number]  Subjective:    Chief Complaint  Patient presents with   Transitions Of Care    Physical. No questions concerns    Jon Salazar is a 28 y.o. male who presents today for a complete physical exam. He reports consuming a general diet. The patient does not participate in regular exercise at present. He generally feels well. He reports sleeping fairly well. He does have additional problems to discuss today.   Discussed the use of AI scribe software for clinical note transcription with the patient, who gave verbal consent to proceed.  History of Present Illness   Jon Salazar is a 28 year old male who presents for an annual physical exam.  He experiences irritation in the right foot, specifically around the outer side past the heel and the middle of the foot. The sensation is described as 'a little sensitive' and occurs primarily in the morning upon waking. It subsides after walking about ten feet and does not persist throughout the day. No rash or skin changes are present. He recalls a similar soreness in the left heel, which has since resolved.  He has a history of asthma but has not used inhalers since before the COVID-19 pandemic. No current symptoms are reported.  His diet includes frequent eating out, with a preference for grilled chicken wraps, hot dogs, and burgers, and he consumes fruit regularly. He does not eat many vegetables. Physical activity includes occasional walking and infrequent basketball games. 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  He has a history of slightly elevated creatinine levels, which were noted in his last blood work from July of the previous year.       Most recent fall risk assessment:    10/20/2023    8:06 AM  Fall  Risk   Falls in the past year? 0  Number falls in past yr: 0  Injury with Fall? 0  Risk for fall due to : No Fall Risks  Follow up Falls evaluation completed     Most recent depression screenings:    10/20/2023    8:09 AM 09/27/2022    9:16 AM  PHQ 2/9 Scores  PHQ - 2 Score 1 0  PHQ- 9 Score 4 2    Vision:Not within last year  and Dental: No current dental problems and Receives regular dental care  Patient Active Problem List   Diagnosis Date Noted   Routine adult health maintenance 09/27/2022   Pes anserinus bursitis of right knee 05/29/2018   Pain of left heel 04/03/2018   Wears contact lenses 07/11/2017   Mild persistent asthma without complication 07/11/2017   Shellfish allergy 07/11/2017   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/2017   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   Elevated blood pressure reading 07/11/2017   Poor vision 09/28/2013   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   Asthma 03/05/2010   DERMATITIS, PERIORAL 03/05/2010   ACNE VULGARIS, 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   Past Medical History:  Diagnosis Date   Allergy    Asthma    Lumbar stress fracture    Past Surgical History:  Procedure Laterality Date   NO PAST SURGERIES     UMBILICAL HERNIA REPAIR     Social History   Tobacco Use  Smoking status: Never   Smokeless tobacco: Never  Vaping Use   Vaping status: Never Used  Substance Use Topics   Alcohol use: No   Drug use: No      Patient Care Team: Lowella Benton CROME, PA as PCP - General (Physician Assistant) Asa Aloysius LABOR, MD (Inactive) as Consulting Physician (Allergy) Curtis Debby PARAS, MD as Consulting Physician (Sports Medicine)   Outpatient Medications Prior to Visit  Medication Sig   [DISCONTINUED] EPINEPHrine  0.3 mg/0.3 mL IJ SOAJ injection Inject 0.3 mg into the muscle as needed for anaphylaxis.   No facility-administered medications prior to visit.    ROS Complete 12 point ROS performed with all pertinent positives  listed in HPI      Objective:     BP 135/79   Pulse 75   Resp 17   Ht 6' 3 (1.905 m)   Wt 177 lb 8 oz (80.5 kg)   SpO2 98%   BMI 22.19 kg/m  BP Readings from Last 3 Encounters:  10/20/23 135/79  09/27/22 (!) 149/88  08/14/20 129/80   Wt Readings from Last 3 Encounters:  10/20/23 177 lb 8 oz (80.5 kg)  09/27/22 179 lb 4 oz (81.3 kg)  08/14/20 152 lb 1.3 oz (69 kg)      Physical Exam Vitals and nursing note reviewed.  Constitutional:      General: He is not in acute distress.    Appearance: Normal appearance. He is not ill-appearing, toxic-appearing or diaphoretic.  HENT:     Head: Normocephalic and atraumatic.     Right Ear: Tympanic membrane, ear canal and external ear normal. There is no impacted cerumen.     Left Ear: Tympanic membrane, ear canal and external ear normal. There is no impacted cerumen.     Nose: Nose normal.     Mouth/Throat:     Mouth: Mucous membranes are moist.     Pharynx: Oropharynx is clear. No oropharyngeal exudate or posterior oropharyngeal erythema.  Eyes:     General: No scleral icterus.       Right eye: No discharge.        Left eye: No discharge.     Extraocular Movements: Extraocular movements intact.     Pupils: Pupils are equal, round, and reactive to light.  Neck:     Thyroid: No thyroid mass, thyromegaly or thyroid tenderness.  Cardiovascular:     Rate and Rhythm: Normal rate and regular rhythm.     Pulses: Normal pulses.     Heart sounds: No murmur heard. Pulmonary:     Effort: Pulmonary effort is normal. No respiratory distress.     Breath sounds: Normal breath sounds. No stridor. No wheezing or rhonchi.  Abdominal:     General: Abdomen is flat. Bowel sounds are normal. There is no distension.     Palpations: Abdomen is soft. There is no mass.     Tenderness: There is no abdominal tenderness. There is no guarding.  Musculoskeletal:     Cervical back: Normal range of motion and neck supple. No rigidity or tenderness.      Right lower leg: No edema.     Left lower leg: No edema.       Feet:     Comments: Pes planus bilaterally. Unable to reproduce the pain noted to R lateral foot  Lymphadenopathy:     Cervical: No cervical adenopathy.  Skin:    General: Skin is warm and dry.     Coloration: Skin is not  jaundiced.     Findings: No bruising, erythema or rash.  Neurological:     General: No focal deficit present.     Mental Status: He is alert and oriented to person, place, and time.     Sensory: No sensory deficit.     Motor: No weakness.  Psychiatric:        Mood and Affect: Mood normal.        Behavior: Behavior normal.    Lab Results  Component Value Date   COLORU yellow 10/20/2023   CLARITYU clear 10/20/2023   GLUCOSEUR negative 10/20/2023   BILIRUBINUR negative 10/20/2023   SPECGRAV <=1.005 (A) 10/20/2023   RBCUR negative 10/20/2023   PHUR 5.5 10/20/2023   PROTEINUR NEGATIVE 08/11/2018   UROBILINOGEN 0.2 10/20/2023   LEUKOCYTESUR Negative 10/20/2023      No results found for any visits on 10/20/23. Last CBC Lab Results  Component Value Date   WBC 4.6 09/27/2022   HGB 14.9 09/27/2022   HCT 46.8 09/27/2022   MCV 90 09/27/2022   MCH 28.7 09/27/2022   RDW 11.7 09/27/2022   PLT 231 09/27/2022   Last metabolic panel Lab Results  Component Value Date   GLUCOSE 74 09/27/2022   NA 141 09/27/2022   K 3.9 09/27/2022   CL 102 09/27/2022   CO2 23 09/27/2022   BUN 10 09/27/2022   CREATININE 1.43 (H) 09/27/2022   EGFR 69 09/27/2022   CALCIUM 9.5 09/27/2022   PROT 7.3 08/14/2020   BILITOT 0.7 08/14/2020   AST 15 08/14/2020   ALT 18 08/14/2020   Last lipids Lab Results  Component Value Date   CHOL 159 09/27/2022   HDL 52 09/27/2022   LDLCALC 96 09/27/2022   TRIG 54 09/27/2022   CHOLHDL 3.1 09/27/2022   Last hemoglobin A1c No results found for: HGBA1C      Assessment & Plan:    Routine Health Maintenance and Physical Exam  Immunization History  Administered Date(s)  Administered   DTaP 10/20/1996, 12/08/1996, 02/08/1997, 02/07/1998, 09/17/2001   HIB (PRP-OMP) 10/20/1996, 12/08/1996, 02/08/1997, 02/07/1998   HPV 9-valent 08/11/2018, 10/12/2018, 01/04/2019   Hepatitis A 04/03/2007   Hepatitis A, Ped/Adol-2 Dose 09/28/2013   Hepatitis B December [birth year], 1996, 09/13/1996, 05/11/1997   IPV 10/20/1996, 12/08/1996, 08/09/1997, 08/18/2001   Influenza Whole 03/26/2000, 12/18/2000, 12/03/2002   Influenza,inj,Quad PF,6+ Mos 12/14/2012, 12/16/2013, 01/14/2018, 01/04/2019   Janssen (J&J) SARS-COV-2 Vaccination 06/09/2019   MMR 08/09/1997, 08/18/2001   Meningococcal Conjugate 10/25/2011, 09/28/2013   Moderna Sars-Covid-2 Vaccination 02/28/2020   Pneumococcal Conjugate-13 09/15/2000   Pneumococcal Polysaccharide-23 01/14/2018   Tdap 10/25/2011, 09/27/2022   Varicella 08/09/1997, 04/03/2007    Health Maintenance  Topic Date Due   HPV VACCINES (3 - Male 3-dose series) 03/29/2019   COVID-19 Vaccine (3 - 2024-25 season) 11/05/2023 (Originally 11/03/2022)   INFLUENZA VACCINE  06/01/2024 (Originally 10/03/2023)   DTaP/Tdap/Td (8 - Td or Tdap) 09/26/2032   Pneumococcal Vaccine (3 of 3 - PCV20 or PCV21) 08/09/2046   Hepatitis B Vaccines 19-59 Average Risk  Completed   Hepatitis C Screening  Completed   HIV Screening  Completed   Meningococcal B Vaccine  Aged Out    Discussed health benefits of physical activity, and encouraged him to engage in regular exercise appropriate for his age and condition.  Problem List Items Addressed This Visit     Tree nut allergy   Relevant Medications   EPINEPHrine  0.3 mg/0.3 mL IJ SOAJ injection   Shellfish allergy   Relevant Medications   EPINEPHrine   0.3 mg/0.3 mL IJ SOAJ injection   Routine adult health maintenance - Primary   Relevant Orders   Lipid panel   CBC   Comprehensive Metabolic Panel (CMET)   TSH   HgB A1c   POCT URINALYSIS DIP (CLINITEK)   Other Visit Diagnoses       Pes planus of both feet         Elevated serum  creatinine       Relevant Orders   POCT URINALYSIS DIP (CLINITEK)      Return in about 1 year (around 10/19/2024) for Annual Physical.  Assessment and Plan    Right foot pain associated with pes planus Intermittent right foot pain, likely due to pes planus affecting foot biomechanics. Differential includes peroneal tendon irritation. - Monitor symptoms and advise on weight management. - Consider Doctor Scholl's metatarsal shoe inserts if pain persists. - Reassess if symptoms worsen or persist.  Elevated creatinine Previous creatinine slightly elevated. - Order routine blood work to recheck creatinine levels. - Obtain urine sample to check for protein or blood - NEGATIVE   Adult wellness visit Routine wellness visit with no acute concerns. Discussed lifestyle factors including diet, exercise, and sleep. - Performed annual physical examination.          Benton LITTIE Gave, PA

## 2023-10-21 ENCOUNTER — Ambulatory Visit: Payer: Self-pay | Admitting: Urgent Care

## 2023-10-21 LAB — COMPREHENSIVE METABOLIC PANEL WITH GFR
ALT: 20 IU/L (ref 0–44)
AST: 16 IU/L (ref 0–40)
Albumin: 4.5 g/dL (ref 4.3–5.2)
Alkaline Phosphatase: 74 IU/L (ref 44–121)
BUN/Creatinine Ratio: 8 — ABNORMAL LOW (ref 9–20)
BUN: 10 mg/dL (ref 6–20)
Bilirubin Total: 0.6 mg/dL (ref 0.0–1.2)
CO2: 22 mmol/L (ref 20–29)
Calcium: 9.5 mg/dL (ref 8.7–10.2)
Chloride: 102 mmol/L (ref 96–106)
Creatinine, Ser: 1.29 mg/dL — ABNORMAL HIGH (ref 0.76–1.27)
Globulin, Total: 2.5 g/dL (ref 1.5–4.5)
Glucose: 88 mg/dL (ref 70–99)
Potassium: 4.1 mmol/L (ref 3.5–5.2)
Sodium: 139 mmol/L (ref 134–144)
Total Protein: 7 g/dL (ref 6.0–8.5)
eGFR: 78 mL/min/1.73 (ref >59)

## 2023-10-21 LAB — CBC
Hematocrit: 45.7 % (ref 37.5–51.0)
Hemoglobin: 14.3 g/dL (ref 13.0–17.7)
MCH: 28.4 pg (ref 26.6–33.0)
MCHC: 31.3 g/dL — ABNORMAL LOW (ref 31.5–35.7)
MCV: 91 fL (ref 79–97)
Platelets: 224 x10E3/uL (ref 150–450)
RBC: 5.03 x10E6/uL (ref 4.14–5.80)
RDW: 12 % (ref 11.6–15.4)
WBC: 4.1 x10E3/uL (ref 3.4–10.8)

## 2023-10-21 LAB — LIPID PANEL
Chol/HDL Ratio: 3 ratio (ref 0.0–5.0)
Cholesterol, Total: 157 mg/dL (ref 100–199)
HDL: 52 mg/dL (ref >39)
LDL Chol Calc (NIH): 94 mg/dL (ref 0–99)
Triglycerides: 56 mg/dL (ref 0–149)
VLDL Cholesterol Cal: 11 mg/dL (ref 5–40)

## 2023-10-21 LAB — HEMOGLOBIN A1C
Est. average glucose Bld gHb Est-mCnc: 108 mg/dL
Hgb A1c MFr Bld: 5.4 % (ref 4.8–5.6)

## 2023-10-21 LAB — TSH: TSH: 1.48 u[IU]/mL (ref 0.450–4.500)

## 2023-10-23 ENCOUNTER — Other Ambulatory Visit (HOSPITAL_COMMUNITY): Payer: Self-pay

## 2023-10-23 ENCOUNTER — Encounter: Payer: Self-pay | Admitting: Pharmacy Technician

## 2023-10-23 ENCOUNTER — Telehealth: Payer: Self-pay | Admitting: Pharmacy Technician

## 2023-10-23 NOTE — Telephone Encounter (Signed)
 A user error has taken place: encounter opened in error, closed for administrative reasons.

## 2023-10-23 NOTE — Telephone Encounter (Signed)
 Pharmacy Patient Advocate Encounter   Received notification from Patient Pharmacy that prior authorization for epinephrine  pen is required/requested.   Insurance verification completed.   The patient is insured through [employer] .   Per test claim: Ins preferred a specific NDC. Called pharmacy. They were able to correct it yesterday and order the appropriate NCD.

## 2023-10-27 ENCOUNTER — Other Ambulatory Visit (HOSPITAL_COMMUNITY): Payer: Self-pay

## 2024-10-19 ENCOUNTER — Encounter: Admitting: Urgent Care
